# Patient Record
Sex: Male | Born: 1935 | Race: White | Hispanic: No | Marital: Single | Smoking: Never smoker
Health system: Southern US, Community
[De-identification: ages and names within clinical notes are randomized; demographics above are authoritative.]

## PROBLEM LIST (undated history)

## (undated) DIAGNOSIS — I739 Peripheral vascular disease, unspecified: Secondary | ICD-10-CM

## (undated) DIAGNOSIS — L089 Local infection of the skin and subcutaneous tissue, unspecified: Secondary | ICD-10-CM

## (undated) DIAGNOSIS — I10 Essential (primary) hypertension: Secondary | ICD-10-CM

## (undated) DIAGNOSIS — E782 Mixed hyperlipidemia: Secondary | ICD-10-CM

## (undated) DIAGNOSIS — N183 Chronic kidney disease, stage 3 (moderate): Secondary | ICD-10-CM

## (undated) HISTORY — DX: Local infection of the skin and subcutaneous tissue, unspecified: L08.9

## (undated) HISTORY — DX: Peripheral vascular disease, unspecified: I73.9

## (undated) HISTORY — DX: Essential (primary) hypertension: I10

## (undated) HISTORY — DX: Mixed hyperlipidemia: E78.2

## (undated) HISTORY — PX: KNEE ARTHROSCOPY: SUR90

## (undated) HISTORY — DX: Chronic kidney disease, stage 3 (moderate): N18.3

## (undated) HISTORY — PX: CATARACT EXTRACTION: SUR2

---

## 1992-04-29 HISTORY — PX: CORONARY ANGIOPLASTY: SHX604

## 2006-12-29 HISTORY — PX: KNEE ARTHROSCOPY: SUR90

## 2015-11-17 DIAGNOSIS — I10 Essential (primary) hypertension: Secondary | ICD-10-CM

## 2015-11-17 DIAGNOSIS — N183 Chronic kidney disease, stage 3 unspecified: Secondary | ICD-10-CM

## 2015-11-17 DIAGNOSIS — E782 Mixed hyperlipidemia: Secondary | ICD-10-CM

## 2015-11-17 HISTORY — DX: Mixed hyperlipidemia: E78.2

## 2015-11-17 HISTORY — DX: Chronic kidney disease, stage 3 unspecified: N18.30

## 2015-11-17 HISTORY — DX: Essential (primary) hypertension: I10

## 2017-02-27 HISTORY — PX: OTHER SURGICAL HISTORY: SHX169

## 2017-03-27 LAB — BASIC METABOLIC PANEL
BUN: 30 — AB (ref 4–21)
CREATININE: 1.4 — AB (ref 0.6–1.3)
POTASSIUM: 4.6 (ref 3.4–5.3)
SODIUM: 141 (ref 137–147)

## 2017-03-27 LAB — CBC AND DIFFERENTIAL
HCT: 35 — AB (ref 41–53)
Hemoglobin: 11 — AB (ref 13.5–17.5)
Platelets: 142 — AB (ref 150–399)
WBC: 16.8

## 2017-03-27 LAB — HEPATIC FUNCTION PANEL
ALK PHOS: 71 (ref 25–125)
ALT: 15 (ref 10–40)
AST: 18 (ref 14–40)

## 2017-03-27 LAB — POCT ERYTHROCYTE SEDIMENTATION RATE, NON-AUTOMATED: Sed Rate: 36

## 2017-03-28 DIAGNOSIS — L089 Local infection of the skin and subcutaneous tissue, unspecified: Secondary | ICD-10-CM

## 2017-03-28 HISTORY — DX: Local infection of the skin and subcutaneous tissue, unspecified: L08.9

## 2017-03-28 LAB — BASIC METABOLIC PANEL
BUN: 29 — AB (ref 4–21)
Creatinine: 1.1 (ref 0.6–1.3)
Glucose: 99
Potassium: 3.8 (ref 3.4–5.3)
SODIUM: 141 (ref 137–147)

## 2017-03-29 DIAGNOSIS — I739 Peripheral vascular disease, unspecified: Secondary | ICD-10-CM

## 2017-03-29 HISTORY — PX: OTHER SURGICAL HISTORY: SHX169

## 2017-03-29 HISTORY — DX: Peripheral vascular disease, unspecified: I73.9

## 2017-04-06 LAB — BASIC METABOLIC PANEL
BUN: 26 — AB (ref 4–21)
Creatinine: 1.3 (ref 0.6–1.3)
POTASSIUM: 4.2 (ref 3.4–5.3)
SODIUM: 139 (ref 137–147)

## 2017-04-07 LAB — CBC AND DIFFERENTIAL
HEMATOCRIT: 27 — AB (ref 41–53)
Hemoglobin: 8.3 — AB (ref 13.5–17.5)
PLATELETS: 194 (ref 150–399)
WBC: 12.9

## 2017-04-10 LAB — BASIC METABOLIC PANEL
BUN: 36 — AB (ref 4–21)
CREATININE: 1.3 (ref 0.6–1.3)
Potassium: 4.2 (ref 3.4–5.3)
Sodium: 139 (ref 137–147)

## 2017-04-11 LAB — BASIC METABOLIC PANEL
BUN: 33 — AB (ref 4–21)
Creatinine: 1.2 (ref 0.6–1.3)
Potassium: 5.2 (ref 3.4–5.3)
Sodium: 137 (ref 137–147)

## 2017-04-11 LAB — CBC AND DIFFERENTIAL
HEMATOCRIT: 27 — AB (ref 41–53)
HEMOGLOBIN: 8.2 — AB (ref 13.5–17.5)

## 2017-04-14 ENCOUNTER — Non-Acute Institutional Stay (SKILLED_NURSING_FACILITY): Payer: Medicare Other | Admitting: Adult Health

## 2017-04-14 ENCOUNTER — Encounter: Payer: Self-pay | Admitting: Adult Health

## 2017-04-14 DIAGNOSIS — I10 Essential (primary) hypertension: Secondary | ICD-10-CM | POA: Diagnosis not present

## 2017-04-14 DIAGNOSIS — F1090 Alcohol use, unspecified, uncomplicated: Secondary | ICD-10-CM

## 2017-04-14 DIAGNOSIS — M869 Osteomyelitis, unspecified: Secondary | ICD-10-CM

## 2017-04-14 DIAGNOSIS — D62 Acute posthemorrhagic anemia: Secondary | ICD-10-CM | POA: Diagnosis not present

## 2017-04-14 DIAGNOSIS — I739 Peripheral vascular disease, unspecified: Secondary | ICD-10-CM

## 2017-04-14 DIAGNOSIS — R339 Retention of urine, unspecified: Secondary | ICD-10-CM | POA: Diagnosis not present

## 2017-04-14 DIAGNOSIS — Z7289 Other problems related to lifestyle: Secondary | ICD-10-CM | POA: Diagnosis not present

## 2017-04-14 DIAGNOSIS — N183 Chronic kidney disease, stage 3 unspecified: Secondary | ICD-10-CM

## 2017-04-14 DIAGNOSIS — I2510 Atherosclerotic heart disease of native coronary artery without angina pectoris: Secondary | ICD-10-CM | POA: Diagnosis not present

## 2017-04-14 NOTE — Progress Notes (Signed)
Location:  [facility] Room Number: 762-G Place of Service:  SNF (31) Provider:  Durenda Age, NP  No care team member to display  No emergency contact information on file.  Code Status:  Full Code   Chief Complaint  Patient presents with  . Acute Visit    Followup on hospitalization at [facility] 11/29-12/15/18 for left foot ulcer with osteomyelitis     HPI:  Pt is a 83 y.o. male seen today for followup of hospitalization at [facility] 03/27/17-04/12/17 for left foot osteomyelitis, S/P left foot 4th ray amputation. He reported a callus on the bottom of his foot with increasing pain and swelling. He was started on empiric Vancomycin and Unasyn. MRI showed osteomyelitis of the 4th metatarsal head. Podiatry, Dr. Prudy Feeler, was consulted. He underwent debridement with 4th ray amputation on 03/30/17. There was extensive infection noted and patient bled poorly. Vascular surgery was consulted and had angiogram on 03/31/17. Patient had a chronically occluded arterial tibial artery for which he had angioplasty of left distal posterior tibial artery and proximal peroneal artery. He was brought back to the operating room on 04/04/17. Intraoperative tissue and bone culture grew Proteus mirabilis. Dr. Prudy Feeler, podiatry, recommended 3 weeks of oral antibiotics from date of closure. He was getting Rocephin and was transitioned to Augmentin (stop date 04/25/17). He will be nonweightbearing on the left. Protective cast was placed on 04/09/17. He had complication of urinary retention with gross hematuria. It was thought to be due to traumatic foley catheter placement. Urology was consulted for which he had continuous bladder irrigation. He accidentally pulled out his  3-way foley catheter and was re-inserted on 04/04/17. He continued to have hematuria so urology performed cystoscopy on 04/10/17 and evacuated a large clot in the bladder. Continuous  bladder irrigation was continued X 24 hours then urine became clear and CBI was discontinued.  The patient was admitted to [place] for short-term rehabilitation on 04/12/17.  He has a PMH of benign non-nodular prostatic hyperplasia, CKD stage 3, angina pectoris, essential HTN, frequent falls, mixed HLD, PUD, and non-traumatic rhabdomyolysis. He was seen in the room today. He verbalized that his pain is well-controlled, 4/10.  He has been admitted to [facility] on 04/12/17 for short-term rehabilitation. He was seen in his room today and verbalized that his left foot pain is "tolerable, 4/10."    Past Medical History:  Diagnosis Date  . CKD (chronic kidney disease) stage 3, GFR 30-59 ml/min (HCC) 11/17/2015  . Essential hypertension 11/17/2015  . Left foot infection 03/28/2017  . Mixed hyperlipidemia 11/17/2015  . PAD (peripheral artery disease) ([place]) 03/29/2017  . Urinary retention 03/28/2017   Past Surgical History:  Procedure Laterality Date  . CYSTOSCOPY WIHT CLOT EVACUATION/FULGURATION  02/2017  . FOOT DEBRIDEMENT WITH 4TH RAY AMPUTATION Left 03/2017    No Known Allergies  Outpatient Encounter Medications as of 04/14/2017  Medication Sig  . acetaminophen (TYLENOL) 325 MG tablet Take 650 mg by mouth every 6 (six) hours as needed for mild pain or moderate pain.  Marland Kitchen amoxicillin-clavulanate (AUGMENTIN) 875-125 MG tablet Take 1 tablet by mouth 2 (two) times daily.  Marland Kitchen aspirin 81 MG chewable tablet Chew 81 mg by mouth daily.  . clotrimazole-betamethasone (LOTRISONE) cream Apply 1 application topically 2 (two) times daily. Apply to perineum and groin  . ferrous sulfate 325 (65 FE) MG tablet Take 325 mg by mouth 2 (two) times daily with a meal.  .  folic acid (FOLVITE) 1 MG tablet Take 1 mg by mouth daily.  Marland Kitchen HYDROcodone-acetaminophen (NORCO/VICODIN) 5-325 MG tablet Take 1 tablet by mouth every 6 (six) hours as needed for severe pain.  Marland Kitchen  lisinopril (PRINIVIL,ZESTRIL) 5 MG tablet Take 5 mg by mouth daily.  . metoprolol succinate (TOPROL-XL) 50 MG 24 hr tablet Take 50 mg by mouth daily. Take with or immediately following a meal.  . nitroGLYCERIN (NITROSTAT) 0.4 MG SL tablet Place 0.4 mg under the tongue every 5 (five) minutes as needed for chest pain.  . pravastatin (PRAVACHOL) 20 MG tablet Take 20 mg by mouth daily.  . tamsulosin (FLOMAX) 0.4 MG CAPS capsule Take 0.4 mg by mouth daily.  Marland Kitchen thiamine 100 MG tablet Take 100 mg by mouth daily.   No facility-administered encounter medications on file as of 04/14/2017.     Review of Systems  GENERAL: No change in appetite, no fatigue, no weight changes, no fever, chills or weakness MOUTH and THROAT: Denies oral discomfort, gingival pain or bleeding RESPIRATORY: no cough, SOB, DOE, wheezing, hemoptysis CARDIAC: No chest pain, edema or palpitations GI: No abdominal pain, diarrhea, constipation, heart burn, nausea or vomiting GU: Denies dysuria,  PSYCHIATRIC: Denies feelings of depression or anxiety. No report of hallucinations, insomnia, paranoia, or agitation     Vitals:   04/14/17 1348  BP: 132/68  Pulse: 68  Resp: 18  Temp: 98 F (36.7 C)  TempSrc: Oral  SpO2: 99%  Weight: 170 lb (77.1 kg)  Height: 5\' 10"  (1.778 m)   Body mass index is 24.39 kg/m.  Physical Exam  GENERAL APPEARANCE: Well nourished. In no acute distress. Normal body habitus SKIN:  Skin is warm and dry.  MOUTH and THROAT: Lips are without lesions. Oral mucosa is moist and without lesions. RESPIRATORY: Breathing is even & unlabored, BS CTAB CARDIAC: RRR, no murmur,no extra heart sounds, no edema GI: Abdomen soft, normal BS, no masses, no tenderness GU:  Has foley catheter draining to urine bag with clear yellowish urine EXTREMITIES:  Able to move X 4 extremities, Left foot and lower leg on cast PSYCHIATRIC: Alert to self, time, does not know where he is.  Affect and behavior are appropriate      Labs reviewed: Recent Labs    03/28/17 04/10/17 04/11/17  NA 141 139 137  K 3.8 4.2 5.2  BUN 29* 36* 33*  CREATININE 1.1 1.3 1.2   Recent Labs    03/27/17  AST 18  ALT 15  ALKPHOS 71   Recent Labs    03/27/17 04/07/17 04/11/17  WBC 16.8 12.9  --   HGB 11.0* 8.3* 8.2*  HCT 35* 27* 27*  PLT 142* 194  --     Assessment/Plan  1. Osteomyelitis of toe of left foot ([place]) - S/P amputation of left foot 4th ray on 03/30/17, was empirically started on vancomycin and Unasyn. MRI showed ostial melitis of the fourth metatarsal head. Podiatry, Dr. Prudy Feeler, was consulted. He underwent debridement with 4th ray amputation on 03/30/17. There was extensive infection noted and patient bled poorly. Vascular surgery was consulted and had angiogram on 03/31/17. Patient had a chronically occluded arterial tibial artery for which he had angioplasty of left distal posterior tibial artery and proximal peroneal artery. He was brought back to the operating room on 04/04/17. Intraoperative tissue and bone culture grew Proteus mirabilis. Dr. Prudy Feeler, podiatry, recommended 3 weeks of oral antibiotics from date of closure. He was getting Rocephin and was transitioned to Augmentin (stop date  04/25/17). He will be nonweightbearing on the left. Protective cast was placed on 04/09/17. Follow-up with Dr. Erline Hau, podiatry, continue Tylenol 325 mg give 2 tabs = 650 mg every 6 hours when necessary and Norco 5-325 milligrams 1 tab every 6 hours when necessary for pain   2. PAD (peripheral artery disease) ([place]) -  he bled poorly. Vascular surgery was consulted and had angiogram on 03/31/17. Patient had a chronically occluded arterial tibial artery for which he had angioplasty of left distal posterior tibial artery and proximal peroneal artery, will continue pravastatin 20 mg 1 tab daily and aspirin 81 mg 1 tab daily. Follow-up with Vascular surgery, Dr. Rebecca Eaton   3. Urinary retention - He had complication of  urinary retention with gross hematuria. It was thought to be due to traumatic foley catheter placement. Urology was consulted for which he had continuous bladder irrigation. He accidentally pulled out his  3-way foley catheter and was re-inserted on 04/04/17. He continued to have hematuria so urology performed cystoscopy on 04/10/17 and evacuated a large clot in the bladder. Continuous bladder irrigation was continued X 24 hours then urine became clear and CBI was discontinued. Follow-up with urology, Dr. Ottie Glazier, in 2 weeks for trial voiding, continue Flomax 0.4 mg 1 capsule daily   4. Anemia due to acute blood loss - S/P surgery, continue iron 325 mg 1 tab twice a day, check CBC in 1 week Lab Results  Component Value Date   HGB 8.2 (A) 04/11/2017     5. Chronic alcohol use - monitor for withdrawal, continue folic acid 1 mg 1 tab daily and thiamine 100 mg daily   6. Coronary artery disease involving native coronary artery of native heart without angina pectoris - no complaints of chest pain, continue NTG when necessary, aspirin 81 mg 1 tab daily   7. Chronic kidney disease, stage 3 (HCC) - will recheck BMP in 1 week Lab Results  Component Value Date   CREATININE 1.2 04/11/2017   8. Essential hypertension -  continue lisinopril 5 mg 1 tab daily and Toprol-XL 50 mg 1 tab daily    Family/ staff Communication:  Discussed plan of care with patient.  Labs/tests ordered:  CBC and BMP in 1 week  Goals of care:   Short-term care   Durenda Age, NP [facility] [phone] (Monday-Friday 8:00 a.m. - 5:00 p.m.) [phone] (after hours)

## 2017-04-15 ENCOUNTER — Non-Acute Institutional Stay (SKILLED_NURSING_FACILITY): Payer: Medicare Other | Admitting: Internal Medicine

## 2017-04-15 ENCOUNTER — Encounter: Payer: Self-pay | Admitting: Internal Medicine

## 2017-04-15 DIAGNOSIS — D489 Neoplasm of uncertain behavior, unspecified: Secondary | ICD-10-CM | POA: Diagnosis not present

## 2017-04-15 DIAGNOSIS — R319 Hematuria, unspecified: Secondary | ICD-10-CM | POA: Insufficient documentation

## 2017-04-15 DIAGNOSIS — I2510 Atherosclerotic heart disease of native coronary artery without angina pectoris: Secondary | ICD-10-CM | POA: Insufficient documentation

## 2017-04-15 DIAGNOSIS — N183 Chronic kidney disease, stage 3 unspecified: Secondary | ICD-10-CM

## 2017-04-15 DIAGNOSIS — D62 Acute posthemorrhagic anemia: Secondary | ICD-10-CM

## 2017-04-15 DIAGNOSIS — M869 Osteomyelitis, unspecified: Secondary | ICD-10-CM

## 2017-04-15 DIAGNOSIS — R310 Gross hematuria: Secondary | ICD-10-CM | POA: Diagnosis not present

## 2017-04-15 DIAGNOSIS — G9341 Metabolic encephalopathy: Secondary | ICD-10-CM | POA: Diagnosis not present

## 2017-04-15 DIAGNOSIS — I739 Peripheral vascular disease, unspecified: Secondary | ICD-10-CM | POA: Diagnosis not present

## 2017-04-15 DIAGNOSIS — E782 Mixed hyperlipidemia: Secondary | ICD-10-CM | POA: Insufficient documentation

## 2017-04-15 DIAGNOSIS — I809 Phlebitis and thrombophlebitis of unspecified site: Secondary | ICD-10-CM | POA: Insufficient documentation

## 2017-04-15 NOTE — Assessment & Plan Note (Signed)
Monitor at [facility]

## 2017-04-15 NOTE — Assessment & Plan Note (Addendum)
Continue Augmentin until 04/25/17 Add probiotic

## 2017-04-15 NOTE — Progress Notes (Signed)
NURSING HOME LOCATION:  [facility] ROOM NUMBER:  [id number]  CODE STATUS:  Full Code  PCP: Dr. Ivan Anchors, [place], [place]. He also is seen at the [place] [facility] in [place]  This is a comprehensive admission note to [facility] performed on this date less than 30 days from date of admission. Included are preadmission medical/surgical history;reconciled medication list; family history; social history and comprehensive review of systems.  Corrections and additions to the records were documented. Comprehensive physical exam was also performed. Additionally a clinical summary was entered for each active diagnosis pertinent to this admission in the Problem List to enhance continuity of care.  HPI: The patient was hospitalized 11/29-12/15/18 for osteomyelitis of the toe left foot. He was transferred from the [facility] facility to [facility] in [place]. MRI revealed osteomyelitis of the first metatarsal head. On 12/20 he underwent debridement with fourth ray amputation. Culture @ follow up debridement 12/7 revealed Proteus mirabilis. Rocephin was initiated and subsequently transitioned to Augmentin with a stop date of 12/28. Postoperatively there was poor bleeding prompting a vascular evaluation which revealed opacity of the left distal tibial artery and proximal peroneal artery.Angioplasty performed 12/3. Serial ABIs 11/30 &12/4 revealed improved left great toe pressure from 28 to 45. Aspirin and pravastatin were continued. Urinary retention of 500 mL prompted Foley placement 11/30. Gross hematuria resulted attributed to traumatic placement. Continuous bladder irrigation did not control the bleeding. The patient removed the Foley in the context of delirium 12/2. Because of persistent retention the catheter was reinserted 12/7. Cystoscopy 12/13 revealed a large clot in the bladder without active bleeding or tumor. Dr. Vernard Gambles plans follow-up in 2-3 weeks for voiding  trial versus Foley exchange. Hospital course was also complicated by delirium attributed to the acute illness & hospitalization superimposed on mild cognitive impairment. The patient did have a history of social alcohol use. Clinically alcohol withdrawal was not felt to be present. He did receive thiamine and folic acid. The patient did have significant anemia with hemoglobins as low as 7.9. On 12/14 hemoglobin was 8.2 and hematocrit 26.9. He did have "mahogany stools" on one occasion while hospitalized. The patient refused endoscopy by Dr. Reather Laurence, GI. Labs 12/14 revealed hemoglobin 8.2, hematocrit 26.9 BUN 33, creatinine 1.20 Here at the SNF staff reports that he is draining some blood from the irrigation port of the Foley. The irrigation port is not occluded  Past medical and surgical history: Includes mixed dyslipidemia, essential hypertension, and chronic kidney disease, stage III with GFR 30-59. Surgeries include bilateral knee arthroscopy. He had coronary angioplasty in 1994.  Social history: Social drinker, never smoked. He states he drinks red wine, one glass with evening meals. 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 Initially was in the [employer] but then transferred to the [employer]. He reads extensively, both fiction and history.  Family history: Reviewed  Review of systems: He states he's not "paid any attention" to the blood in the Foley. He has no genitourinary or GI symptoms.  Constitutional: No fever, significant weight change, fatigue  Eyes: No redness, discharge, pain, vision change ENT/mouth: No nasal congestion,  purulent discharge, earache, change in hearing, sore throat  Cardiovascular: No chest pain, palpitations, paroxysmal nocturnal dyspnea, claudication, edema  Respiratory: No cough, sputum production, hemoptysis, DOE, significant snoring, apnea   Gastrointestinal: No heartburn, dysphagia, abdominal pain, nausea/vomiting, rectal bleeding, melena, change in  bowels Genitourinary: No dysuria,  pyuria, incontinence, nocturia Musculoskeletal: No joint stiffness, joint swelling, weakness, pain Dermatologic:  No rash, pruritus, change in appearance of skin Neurologic: No dizziness, headache, syncope, seizures, numbness, ttingling Psychiatric: No significant anxiety , depression, insomnia, anorexia Endocrine: No change in hair/skin/ nails, excessive thirst, excessive hunger, excessive urination  Hematologic/lymphatic: No significant bruising, lymphadenopathy, abnormal bleeding Allergy/immunology: No itchy/ watery eyes, significant sneezing, urticaria, angioedema  Physical exam:  Pertinent or positive findings: Pattern alopecia is present Arcus senilis is present, pupils are small. He has low-grade rhonchi in all lung fields in a diffuse, homogenous pattern. Blood is noted in the Foley catheter and on the bed sheet. The pedal pulses the right lower extremity are decreased. The left lower extremity is casted. He has mixed mild arthritic changes of the hands. There is a large wart-like, hyperpigmented growth over the left pinna. The hyperpigmentation suggest ecchymosis. He states that he does scratch this lesion repeatedly.  General appearance: Adequately nourished; no acute distress, increased work of breathing is present.   Lymphatic: No lymphadenopathy about the head, neck, axilla . Eyes: No conjunctival inflammation or lid edema is present. There is no scleral icterus. Ears:  External ear exam shows no significant lesions or deformities.   Nose:  External nasal examination shows no deformity or inflammation. Nasal mucosa are pink and moist without lesions, exudates Oral exam: Lips and gums are healthy appearing.There is no oropharyngeal erythema or exudate . Neck:  No thyromegaly, masses, tenderness noted.    Heart:  Normal rate and regular rhythm. S1 and S2 normal without gallop, murmur, click, rub .  Lungs:  without wheezes, rales, rubs. Abdomen: Bowel  sounds are normal. Abdomen is soft and nontender with no organomegaly, hernias, masses. GU: Deferred  Extremities:  No cyanosis, clubbing  Neurologic exam:   Strength equal  in upper & lower extremities Balance, Rhomberg, finger to nose testing could not be completed due to clinical state Skin: Warm & dry w/o tenting. No significant  rash.  See clinical summary under each active problem in the Problem List with associated updated therapeutic plan

## 2017-04-15 NOTE — Assessment & Plan Note (Signed)
Minimize iatrogenic factors

## 2017-04-15 NOTE — Patient Instructions (Addendum)
See assessment and plan under each diagnosis in the problem list and acutely for this visit Total time 56  minutes; greater than 50% of the visit spent reviewing extensive outside medical records to create Epic chart,counseling patient and coordinating care for problems addressed at this encounter

## 2017-04-15 NOTE — Assessment & Plan Note (Signed)
Prophylactic aspirin, continue statin

## 2017-04-15 NOTE — Assessment & Plan Note (Addendum)
04/15/17 some blood draining from the open irrigation port. Nursing inquired about changing the Foley. He has a history of traumatic hematuria. Because of his complicated urologic history, this should be deferred to his Urologist Urology clinic follow-up at [facility] in [place]

## 2017-04-16 ENCOUNTER — Inpatient Hospital Stay (HOSPITAL_COMMUNITY)
Admission: EM | Admit: 2017-04-16 | Discharge: 2017-04-19 | DRG: 699 | Disposition: A | Discharge status: Skilled Nursing Facility | Payer: Medicare Other | Attending: Internal Medicine | Admitting: Internal Medicine

## 2017-04-16 ENCOUNTER — Emergency Department (HOSPITAL_COMMUNITY): Payer: Medicare Other

## 2017-04-16 ENCOUNTER — Encounter (HOSPITAL_COMMUNITY): Payer: Self-pay | Admitting: Emergency Medicine

## 2017-04-16 DIAGNOSIS — F0390 Unspecified dementia without behavioral disturbance: Secondary | ICD-10-CM | POA: Diagnosis present

## 2017-04-16 DIAGNOSIS — D489 Neoplasm of uncertain behavior, unspecified: Secondary | ICD-10-CM | POA: Insufficient documentation

## 2017-04-16 DIAGNOSIS — N401 Enlarged prostate with lower urinary tract symptoms: Secondary | ICD-10-CM | POA: Diagnosis present

## 2017-04-16 DIAGNOSIS — Z7982 Long term (current) use of aspirin: Secondary | ICD-10-CM

## 2017-04-16 DIAGNOSIS — T83028A Displacement of other indwelling urethral catheter, initial encounter: Secondary | ICD-10-CM | POA: Diagnosis not present

## 2017-04-16 DIAGNOSIS — N400 Enlarged prostate without lower urinary tract symptoms: Secondary | ICD-10-CM | POA: Diagnosis not present

## 2017-04-16 DIAGNOSIS — D62 Acute posthemorrhagic anemia: Secondary | ICD-10-CM | POA: Diagnosis not present

## 2017-04-16 DIAGNOSIS — N183 Chronic kidney disease, stage 3 unspecified: Secondary | ICD-10-CM | POA: Diagnosis present

## 2017-04-16 DIAGNOSIS — M869 Osteomyelitis, unspecified: Secondary | ICD-10-CM | POA: Diagnosis not present

## 2017-04-16 DIAGNOSIS — R310 Gross hematuria: Secondary | ICD-10-CM | POA: Diagnosis not present

## 2017-04-16 DIAGNOSIS — D72829 Elevated white blood cell count, unspecified: Secondary | ICD-10-CM | POA: Diagnosis present

## 2017-04-16 DIAGNOSIS — Z9861 Coronary angioplasty status: Secondary | ICD-10-CM

## 2017-04-16 DIAGNOSIS — D649 Anemia, unspecified: Secondary | ICD-10-CM | POA: Diagnosis not present

## 2017-04-16 DIAGNOSIS — N3289 Other specified disorders of bladder: Secondary | ICD-10-CM | POA: Diagnosis present

## 2017-04-16 DIAGNOSIS — R319 Hematuria, unspecified: Secondary | ICD-10-CM | POA: Diagnosis present

## 2017-04-16 DIAGNOSIS — I129 Hypertensive chronic kidney disease with stage 1 through stage 4 chronic kidney disease, or unspecified chronic kidney disease: Secondary | ICD-10-CM | POA: Diagnosis present

## 2017-04-16 DIAGNOSIS — R338 Other retention of urine: Secondary | ICD-10-CM | POA: Diagnosis present

## 2017-04-16 DIAGNOSIS — I739 Peripheral vascular disease, unspecified: Secondary | ICD-10-CM | POA: Diagnosis present

## 2017-04-16 DIAGNOSIS — Y846 Urinary catheterization as the cause of abnormal reaction of the patient, or of later complication, without mention of misadventure at the time of the procedure: Secondary | ICD-10-CM | POA: Diagnosis present

## 2017-04-16 DIAGNOSIS — M86672 Other chronic osteomyelitis, left ankle and foot: Secondary | ICD-10-CM | POA: Diagnosis present

## 2017-04-16 DIAGNOSIS — Z807 Family history of other malignant neoplasms of lymphoid, hematopoietic and related tissues: Secondary | ICD-10-CM

## 2017-04-16 DIAGNOSIS — N3091 Cystitis, unspecified with hematuria: Secondary | ICD-10-CM | POA: Diagnosis present

## 2017-04-16 DIAGNOSIS — E782 Mixed hyperlipidemia: Secondary | ICD-10-CM | POA: Diagnosis present

## 2017-04-16 LAB — COMPREHENSIVE METABOLIC PANEL
ALK PHOS: 71 U/L (ref 38–126)
ALT: 21 U/L (ref 17–63)
ANION GAP: 7 (ref 5–15)
AST: 23 U/L (ref 15–41)
Albumin: 3.4 g/dL — ABNORMAL LOW (ref 3.5–5.0)
BUN: 41 mg/dL — ABNORMAL HIGH (ref 6–20)
CALCIUM: 9.1 mg/dL (ref 8.9–10.3)
CHLORIDE: 107 mmol/L (ref 101–111)
CO2: 22 mmol/L (ref 22–32)
CREATININE: 1.45 mg/dL — AB (ref 0.61–1.24)
GFR, EST AFRICAN AMERICAN: 46 mL/min — AB (ref >60)
GFR, EST NON AFRICAN AMERICAN: 40 mL/min — AB (ref >60)
Glucose, Bld: 112 mg/dL — ABNORMAL HIGH (ref 65–99)
Potassium: 4.1 mmol/L (ref 3.5–5.1)
SODIUM: 136 mmol/L (ref 135–145)
Total Bilirubin: 0.5 mg/dL (ref 0.3–1.2)
Total Protein: 6 g/dL — ABNORMAL LOW (ref 6.5–8.1)

## 2017-04-16 LAB — URINALYSIS, ROUTINE W REFLEX MICROSCOPIC

## 2017-04-16 LAB — CBC WITH DIFFERENTIAL/PLATELET
BASOS PCT: 0 %
Basophils Absolute: 0.1 10*3/uL (ref 0.0–0.1)
EOS ABS: 0.1 10*3/uL (ref 0.0–0.7)
Eosinophils Relative: 1 %
HEMATOCRIT: 24.5 % — AB (ref 39.0–52.0)
HEMOGLOBIN: 7.9 g/dL — AB (ref 13.0–17.0)
Lymphocytes Relative: 12 %
Lymphs Abs: 1.6 10*3/uL (ref 0.7–4.0)
MCH: 33.2 pg (ref 26.0–34.0)
MCHC: 32.2 g/dL (ref 30.0–36.0)
MCV: 102.9 fL — ABNORMAL HIGH (ref 78.0–100.0)
Monocytes Absolute: 0.5 10*3/uL (ref 0.1–1.0)
Monocytes Relative: 3 %
NEUTROS ABS: 12.1 10*3/uL — AB (ref 1.7–7.7)
Neutrophils Relative %: 84 %
Platelets: 152 10*3/uL (ref 150–400)
RBC: 2.38 MIL/uL — AB (ref 4.22–5.81)
RDW: 15.1 % (ref 11.5–15.5)
WBC: 14.3 10*3/uL — AB (ref 4.0–10.5)

## 2017-04-16 LAB — URINALYSIS, MICROSCOPIC (REFLEX)

## 2017-04-16 LAB — PREPARE RBC (CROSSMATCH)

## 2017-04-16 LAB — ABO/RH: ABO/RH(D): O NEG

## 2017-04-16 MED ORDER — SODIUM CHLORIDE 0.9 % IV SOLN
Freq: Once | INTRAVENOUS | Status: AC
  Administered 2017-04-17: 03:00:00 via INTRAVENOUS

## 2017-04-16 MED ORDER — DEXTROSE 5 % IV SOLN
1.0000 g | Freq: Once | INTRAVENOUS | Status: AC
  Administered 2017-04-16: 1 g via INTRAVENOUS
  Filled 2017-04-16: qty 10

## 2017-04-16 NOTE — H&P (Signed)
History and Physical    Jason Calderon YQI:[id number] DOB: 1921/07/26 DOA: 04/16/2017  Referring MD/NP/PA: Waynetta Pean, PA-C PCP: Ivan Anchors, MD  Patient coming from: [facility] via EMS  Chief Complaint: Bloody urine  I have personally briefly reviewed patient's old medical records in [place]   HPI: Jason Calderon is a 83 y.o. male with medical history significant of osteomyelitis of the left foot s/p left 4th digit amputation, dementia, PAD, BPH with urinary retention s/p Foley catheter; who presents with complaints of bloody urine.  Patient was just seen at [facility] for the same thought to be secondary to a traumatic Foley catheter insertion.  At that time he underwent cystoscopy with clot evacuation and fulguration.  There is no note of any significant masses or abnormalities noted at that time.  Bleeding had apparently cleared following discharge, but the patient was noted to again have bloody urine in his Foley bag when seen by his son today.  Patient was initially noted to be in no acute discomfort and had no complaints.  ED Course: Upon admission into the emergency department patient was seen to be afebrile, pulse 56-82, respirations 17-19, blood pressure 105/55 at 153/56, and O2 saturation maintained.  Labs revealed WBC 14.3, hemoglobin 7.9, BUN 41, creatinine 1.45.  Patient was ordered to be transfused 1 unit of blood.  A CT scan was requested and found to reveal that the Foley balloon was in the prostatic urethra.  Dr. Diona Fanti of urology was consulted and recommended replacing Foley catheter.  However, after placing Foley catheter patient was noted to be in significant discomfort.    Review of Systems  Unable to perform ROS: Dementia  Genitourinary: Positive for hematuria.       Penile discomfort  Psychiatric/Behavioral: Positive for memory loss.    Past Medical History:  Diagnosis Date  . CKD (chronic kidney disease) stage 3, GFR 30-59 ml/min (HCC) 11/17/2015    . Essential hypertension 11/17/2015  . Left foot infection 03/28/2017  . Mixed hyperlipidemia 11/17/2015  . PAD (peripheral artery disease) ([place]) 03/29/2017    Past Surgical History:  Procedure Laterality Date  . CATARACT EXTRACTION    . CORONARY ANGIOPLASTY  1994  . CYSTOSCOPY WITH CLOT EVACUATION/FULGURATION  02/2017  . FOOT DEBRIDEMENT WITH 4TH RAY AMPUTATION Left 03/2017  . KNEE ARTHROSCOPY Right 12/2006  . KNEE ARTHROSCOPY Left 11/2007 and 12/2008     reports that  has never smoked. he has never used smokeless tobacco. He reports that he drinks about 5.4 oz of alcohol per week. His drug history is not on file.  No Known Allergies  Family History  Problem Relation Age of Onset  . GI problems Mother   . Breast cancer Sister   . Irritable bowel syndrome Sister   . Lymphoma Sister   . Sudden death Brother     Prior to Admission medications   Medication Sig Start Date End Date Taking? Authorizing Provider  acetaminophen (TYLENOL) 325 MG tablet Take 650 mg by mouth every 6 (six) hours as needed for mild pain or moderate pain.   Yes [provider]  Amino Acids-Protein Hydrolys (FEEDING SUPPLEMENT, PRO-STAT SUGAR FREE 64,) LIQD Take 30 mLs by mouth daily.   Yes [provider]  amoxicillin-clavulanate (AUGMENTIN) 875-125 MG tablet Take 1 tablet by mouth 2 (two) times daily.  04/12/17 04/25/17 Yes [provider]  aspirin 81 MG chewable tablet Chew 81 mg by mouth daily.   Yes [provider]  clotrimazole-betamethasone Donalynn Furlong)  cream Apply 1 application topically 2 (two) times daily. The cream also has Dipropionate Apply to perineum and groin   Yes [provider]  ferrous sulfate 325 (65 FE) MG tablet Take 325 mg by mouth 2 (two) times daily with a meal.   Yes [provider]  folic acid (FOLVITE) 1 MG tablet Take 1 mg by mouth daily.   Yes [provider]  HYDROcodone-acetaminophen (NORCO/VICODIN) 5-325 MG tablet  Take 1 tablet by mouth every 6 (six) hours as needed for severe pain.   Yes [provider]  lisinopril (PRINIVIL,ZESTRIL) 5 MG tablet Take 5 mg by mouth daily.   Yes [provider]  metoprolol succinate (TOPROL-XL) 50 MG 24 hr tablet Take 50 mg by mouth daily. Take with or immediately following a meal.   Yes [provider]  nitroGLYCERIN (NITROSTAT) 0.4 MG SL tablet Place 0.4 mg under the tongue every 5 (five) minutes as needed for chest pain.   Yes [provider]  NUTRITIONAL SUPPLEMENT LIQD Take 120 mLs by mouth. [employer]   Yes [provider]  pravastatin (PRAVACHOL) 20 MG tablet Take 20 mg by mouth daily.   Yes [provider]  senna-docusate (SENOKOT-S) 8.6-50 MG tablet Take 2 tablets by mouth at bedtime.   Yes [provider]  tamsulosin (FLOMAX) 0.4 MG CAPS capsule Take 0.4 mg by mouth daily.   Yes [provider]  thiamine 100 MG tablet Take 100 mg by mouth daily.   Yes [provider]    Physical Exam:  Constitutional: Elderly male who appears to be in some acute discomfort. Vitals:   04/16/17 2030 04/16/17 2045 04/16/17 2100 04/16/17 2115  BP: (!) 145/78 130/61 (!) 153/56 (!) 124/59  Pulse: (!) 57 68 80 70  Resp: 18   18  Temp:      TempSrc:      SpO2: 99% 99% 100% 100%  Weight:      Height:       Eyes: PERRL, lids and conjunctivae normal ENMT: Mucous membranes are moist. Posterior pharynx clear of any exudate or lesions. Neck: normal, supple, no masses, no thyromegaly Respiratory: clear to auscultation bilaterally, no wheezing, no crackles. Normal respiratory effort. No accessory muscle use.  Cardiovascular: Regular rate and rhythm, no murmurs / rubs / gallops. No extremity edema. 2+ pedal pulses. No carotid bruits.  Abdomen: no tenderness, no masses palpated. No hepatosplenomegaly. Bowel sounds positive.  Musculoskeletal: no clubbing / cyanosis. No joint deformity upper and lower  extremities. Good ROM, no contractures. Normal muscle tone.  Skin: no rashes, lesions, ulcers. No induration Neurologic: CN 2-12 grossly intact. Sensation intact, DTR normal. Strength 5/5 in all 4.  Psychiatric: Alert, but oriented only to person.    Labs on Admission: I have personally reviewed following labs and imaging studies  CBC: Recent Labs  Lab 04/11/17 04/16/17 1747  WBC  --  14.3*  NEUTROABS  --  12.1*  HGB 8.2* 7.9*  HCT 27* 24.5*  MCV  --  102.9*  PLT  --  063   Basic Metabolic Panel: Recent Labs  Lab 04/10/17 04/11/17 04/16/17 1747  NA 139 137 136  K 4.2 5.2 4.1  CL  --   --  107  CO2  --   --  22  GLUCOSE  --   --  112*  BUN 36* 33* 41*  CREATININE 1.3 1.2 1.45*  CALCIUM  --   --  9.1   GFR: Estimated Creatinine Clearance: 31.8  mL/min (A) (by C-G formula based on SCr of 1.45 mg/dL (H)). Liver Function Tests: Recent Labs  Lab 04/16/17 1747  AST 23  ALT 21  ALKPHOS 71  BILITOT 0.5  PROT 6.0*  ALBUMIN 3.4*   No results for input(s): LIPASE, AMYLASE in the last 168 hours. No results for input(s): AMMONIA in the last 168 hours. Coagulation Profile: No results for input(s): INR, PROTIME in the last 168 hours. Cardiac Enzymes: No results for input(s): CKTOTAL, CKMB, CKMBINDEX, TROPONINI in the last 168 hours. BNP (last 3 results) No results for input(s): PROBNP in the last 8760 hours. HbA1C: No results for input(s): HGBA1C in the last 72 hours. CBG: No results for input(s): GLUCAP in the last 168 hours. Lipid Profile: No results for input(s): CHOL, HDL, LDLCALC, TRIG, CHOLHDL, LDLDIRECT in the last 72 hours. Thyroid Function Tests: No results for input(s): TSH, T4TOTAL, FREET4, T3FREE, THYROIDAB in the last 72 hours. Anemia Panel: No results for input(s): VITAMINB12, FOLATE, FERRITIN, TIBC, IRON, RETICCTPCT in the last 72 hours. Urine analysis:    Component Value Date/Time   COLORURINE RED (A) 04/16/2017 1747   APPEARANCEUR TURBID (A)  04/16/2017 1747   LABSPEC  04/16/2017 1747    TEST NOT REPORTED DUE TO COLOR INTERFERENCE OF URINE PIGMENT   PHURINE  04/16/2017 1747    TEST NOT REPORTED DUE TO COLOR INTERFERENCE OF URINE PIGMENT   GLUCOSEU (A) 04/16/2017 1747    TEST NOT REPORTED DUE TO COLOR INTERFERENCE OF URINE PIGMENT   HGBUR (A) 04/16/2017 1747    TEST NOT REPORTED DUE TO COLOR INTERFERENCE OF URINE PIGMENT   BILIRUBINUR (A) 04/16/2017 1747    TEST NOT REPORTED DUE TO COLOR INTERFERENCE OF URINE PIGMENT   KETONESUR (A) 04/16/2017 1747    TEST NOT REPORTED DUE TO COLOR INTERFERENCE OF URINE PIGMENT   PROTEINUR (A) 04/16/2017 1747    TEST NOT REPORTED DUE TO COLOR INTERFERENCE OF URINE PIGMENT   NITRITE (A) 04/16/2017 1747    TEST NOT REPORTED DUE TO COLOR INTERFERENCE OF URINE PIGMENT   LEUKOCYTESUR (A) 04/16/2017 1747    TEST NOT REPORTED DUE TO COLOR INTERFERENCE OF URINE PIGMENT   Sepsis Labs: No results found for this or any previous visit (from the past 240 hour(s)).   Radiological Exams on Admission: No results found.  CT scan abdomen: Independently reviewed.  Foley bulb in prosthetic urethra  Assessment/Plan Hematuria with acute blood loss anemia: Acute.  Patient presents with persistent hematuria.  Hemoglobin previously 11.1 on 11/29, and now presents with a hemoglobin of 7.9.  CT scan of the abdomen requested and revealed signs of a blood clot within the bladder with Foley catheter balloon in the prostatic urethra.  Urology consulted and recommended replacing Foley catheter.  However, after replacing Foley catheter patient began to be in acute pain.   - Admit to a telemetry bed - Replacing Foley catheter as recommended - Continue bladder irrigation - Serial monitoring of H&H - Will transfuse as needed for hemoglobin is less than 8 - Continue ferrous sulfate - Dr. Diona Fanti of urology consulted and will see this a.m.  Possible complicated urinary tract infection: Patient with indwelling Foley  catheter found to have normal urinalysis was abnormal showing signs of bacteria and too numerous to count RBCs and WBCs  - Follow-up urine culture - Continue empiric antibiotics of Rocephin  Leukocytosis : WBC elevated at 14.3 which appears slightly higher than previous.  Question possibility of underlying urinary infection. - Recheck CBC in  a.m.  Chronic kidney disease stage III: Presents with a creatinine of 1.45 with a BUN of 41.  Elevated BUN likely secondary to hematuria.  Baseline creatinine previously noted to be around 1.2-1.4 per available records. - Continue to monitor  Chronic osteomyelitis of the left toe - Continue Augmentin with stop date 12/28  Essential hypertension - Continue metoprolol lisinopril  Hyperlipidemia - Statin  BPH - Continue Flomax   DVT prophylaxis:   SCDs Code Status: Full  Family Communication: No family present Disposition Plan: TBD Consults called: urology Admission status: inpatient  Norval Morton MD [facility] Pager [phone]   If 7PM-7AM, please contact night-coverage [email or web address] Password [facility]  04/16/2017, 10:33 PM

## 2017-04-16 NOTE — Assessment & Plan Note (Signed)
Monitor BMET. 

## 2017-04-16 NOTE — ED Notes (Signed)
Gave food to pt per EDP.

## 2017-04-16 NOTE — ED Notes (Signed)
PT left for CT

## 2017-04-16 NOTE — ED Provider Notes (Signed)
[facility] EMERGENCY DEPARTMENT Provider Note   CSN: [id number] Arrival date & time: 04/16/17  1714     History   Chief Complaint Chief Complaint  Patient presents with  . Hematuria    HPI Jason Calderon is a 83 y.o. male.  Jason Calderon is a 83 y.o. Male who presents to the ED from his SNF with hematuria starting yesterday.  Patient has a chronic indwelling Foley catheter after some problems with urinary retention recently.  He had an episode of hematuria after traumatic insertion earlier this month.  He had a cystoscopy by urology on 04/10/2017 which showed no evidence of tumor or bleeding.  He has been staying at the skilled nursing facility for treatment for osteomyelitis of his toe and is currently on Augmentin until 04/25/2017.  Family reports is been compliant with his medication.  They report yesterday and today they noticed frank hematuria from his Foley catheter.  He has had no abdominal pain, nausea or vomiting.  He denies any lightheadedness or dizziness.  No chest pain or shortness of breath.  He denies any pain to his penis or his testicles.  Urine amount has been normal.  He is not on any blood thinners.  He was seen by his primary care doctor yesterday for this and he recommended follow-up as an outpatient with urology clinic.  Son reports that he was worried about the amount of hematuria today and brought him to the emergency department.  No fevers, abdominal pain, lightheadedness, dizziness, chest pain, shortness of breath, nausea, vomiting or rashes.   The history is provided by the patient, medical records and a relative. No language interpreter was used.  Hematuria  Pertinent negatives include no chest pain, no abdominal pain, no headaches and no shortness of breath.    Past Medical History:  Diagnosis Date  . CKD (chronic kidney disease) stage 3, GFR 30-59 ml/min (HCC) 11/17/2015  . Essential hypertension 11/17/2015  . Left foot infection 03/28/2017   . Mixed hyperlipidemia 11/17/2015  . PAD (peripheral artery disease) ([place]) 03/29/2017    Patient Active Problem List   Diagnosis Date Noted  . Neoplasm of uncertain behavior 82/42/3536  . Peripheral vascular disease ([place]) 04/15/2017  . Acute metabolic encephalopathy 14/43/1540  . Hematuria 04/15/2017  . Acute blood loss anemia 04/15/2017  . Coronary artery disease 04/15/2017  . Chronic kidney disease, stage III (moderate) ([place]) 04/15/2017  . Superficial thrombophlebitis 04/15/2017  . Mixed dyslipidemia 04/15/2017  . Osteomyelitis of toe of left foot ([place]) 04/14/2017    Past Surgical History:  Procedure Laterality Date  . CATARACT EXTRACTION    . CORONARY ANGIOPLASTY  1994  . CYSTOSCOPY WITH CLOT EVACUATION/FULGURATION  02/2017  . FOOT DEBRIDEMENT WITH 4TH RAY AMPUTATION Left 03/2017  . KNEE ARTHROSCOPY Right 12/2006  . KNEE ARTHROSCOPY Left 11/2007 and 12/2008       Home Medications    Prior to Admission medications   Medication Sig Start Date End Date Taking? Authorizing Provider  acetaminophen (TYLENOL) 325 MG tablet Take 650 mg by mouth every 6 (six) hours as needed for mild pain or moderate pain.   Yes [provider]  Amino Acids-Protein Hydrolys (FEEDING SUPPLEMENT, PRO-STAT SUGAR FREE 64,) LIQD Take 30 mLs by mouth daily.   Yes [provider]  amoxicillin-clavulanate (AUGMENTIN) 875-125 MG tablet Take 1 tablet by mouth 2 (two) times daily.  04/12/17 04/25/17 Yes [provider]  aspirin 81 MG chewable tablet Chew 81 mg by mouth daily.  Yes [provider]  clotrimazole-betamethasone (LOTRISONE) cream Apply 1 application topically 2 (two) times daily. The cream also has Dipropionate Apply to perineum and groin   Yes [provider]  ferrous sulfate 325 (65 FE) MG tablet Take 325 mg by mouth 2 (two) times daily with a meal.   Yes [provider]  folic acid (FOLVITE) 1 MG tablet Take 1 mg by mouth daily.   Yes  [provider]  HYDROcodone-acetaminophen (NORCO/VICODIN) 5-325 MG tablet Take 1 tablet by mouth every 6 (six) hours as needed for severe pain.   Yes [provider]  lisinopril (PRINIVIL,ZESTRIL) 5 MG tablet Take 5 mg by mouth daily.   Yes [provider]  metoprolol succinate (TOPROL-XL) 50 MG 24 hr tablet Take 50 mg by mouth daily. Take with or immediately following a meal.   Yes [provider]  nitroGLYCERIN (NITROSTAT) 0.4 MG SL tablet Place 0.4 mg under the tongue every 5 (five) minutes as needed for chest pain.   Yes [provider]  NUTRITIONAL SUPPLEMENT LIQD Take 120 mLs by mouth. [employer]   Yes [provider]  pravastatin (PRAVACHOL) 20 MG tablet Take 20 mg by mouth daily.   Yes [provider]  senna-docusate (SENOKOT-S) 8.6-50 MG tablet Take 2 tablets by mouth at bedtime.   Yes [provider]  tamsulosin (FLOMAX) 0.4 MG CAPS capsule Take 0.4 mg by mouth daily.   Yes [provider]  thiamine 100 MG tablet Take 100 mg by mouth daily.   Yes [provider]    Family History Family History  Problem Relation Age of Onset  . GI problems Mother   . Breast cancer Sister   . Irritable bowel syndrome Sister   . Lymphoma Sister   . Sudden death Brother     Social History Social History   Tobacco Use  . Smoking status: Never Smoker  . Smokeless tobacco: Never Used  Substance Use Topics  . Alcohol use: Yes    Alcohol/week: 5.4 oz    Types: 4 Cans of beer, 5 Glasses of wine per week  . Drug use: Not on file     Allergies   Patient has no known allergies.   Review of Systems Review of Systems  Constitutional: Negative for chills and fever.  HENT: Negative for congestion and sore throat.   Eyes: Negative for visual disturbance.  Respiratory: Negative for cough and shortness of breath.   Cardiovascular: Negative for chest pain.  Gastrointestinal: Negative for abdominal pain,  diarrhea, nausea and vomiting.  Genitourinary: Positive for hematuria. Negative for decreased urine volume, dysuria, flank pain, frequency, penile pain, scrotal swelling, testicular pain and urgency.  Musculoskeletal: Negative for back pain and neck pain.  Skin: Negative for rash.  Neurological: Negative for syncope, light-headedness and headaches.     Physical Exam Updated Vital Signs BP (!) 120/56   Pulse 60   Temp 97.9 F (36.6 C) (Oral)   Resp 19   Ht 5\' 9"  (1.753 m)   Wt 80.7 kg (178 lb)   SpO2 98%   BMI 26.29 kg/m   Physical Exam  Constitutional: He appears well-developed and well-nourished. No distress.  Nontoxic-appearing.  HENT:  Head: Normocephalic and atraumatic.  Mouth/Throat: Oropharynx is clear and moist.  Eyes: Right eye exhibits no discharge. Left eye exhibits no discharge.  Neck: Neck supple.  Cardiovascular: Normal rate, regular rhythm, normal heart sounds and intact distal pulses. Exam reveals no gallop and no friction rub.  No murmur heard. Pulmonary/Chest: Effort normal and breath sounds normal. No respiratory distress. He has no wheezes. He has no rales.  Abdominal: Soft. There is no tenderness. There is no guarding.  Genitourinary: No penile tenderness.  Genitourinary Comments: Foley catheter in place.  Frank hematuria in bag. No penile or scrotal TTP.   Musculoskeletal: He exhibits no edema.  Lymphadenopathy:    He has no cervical adenopathy.  Neurological: He is alert. Coordination normal.  Skin: Skin is warm and dry. No rash noted. He is not diaphoretic. No erythema. No pallor.  Psychiatric: He has a normal mood and affect. His behavior is normal.  Nursing note and vitals reviewed.    ED Treatments / Results  Labs (all labs ordered are listed, but only abnormal results are displayed) Labs Reviewed  URINALYSIS, ROUTINE W REFLEX MICROSCOPIC - Abnormal; Notable for the following components:      Result Value   Color, Urine RED (*)     APPearance TURBID (*)    Glucose, UA   (*)    Value: TEST NOT REPORTED DUE TO COLOR INTERFERENCE OF URINE PIGMENT   Hgb urine dipstick   (*)    Value: TEST NOT REPORTED DUE TO COLOR INTERFERENCE OF URINE PIGMENT   Bilirubin Urine   (*)    Value: TEST NOT REPORTED DUE TO COLOR INTERFERENCE OF URINE PIGMENT   Ketones, ur   (*)    Value: TEST NOT REPORTED DUE TO COLOR INTERFERENCE OF URINE PIGMENT   Protein, ur   (*)    Value: TEST NOT REPORTED DUE TO COLOR INTERFERENCE OF URINE PIGMENT   Nitrite   (*)    Value: TEST NOT REPORTED DUE TO COLOR INTERFERENCE OF URINE PIGMENT   Leukocytes, UA   (*)    Value: TEST NOT REPORTED DUE TO COLOR INTERFERENCE OF URINE PIGMENT   All other components within normal limits  COMPREHENSIVE METABOLIC PANEL - Abnormal; Notable for the following components:   Glucose, Bld 112 (*)    BUN 41 (*)    Creatinine, Ser 1.45 (*)    Total Protein 6.0 (*)    Albumin 3.4 (*)    GFR calc non Af Amer 40 (*)    GFR calc Af Amer 46 (*)    All other components within normal limits  CBC WITH DIFFERENTIAL/PLATELET - Abnormal; Notable for the following components:   WBC 14.3 (*)    RBC 2.38 (*)    Hemoglobin 7.9 (*)    HCT 24.5 (*)    MCV 102.9 (*)    Neutro Abs 12.1 (*)    All other components within normal limits  URINALYSIS, MICROSCOPIC (REFLEX) - Abnormal; Notable for the following components:   Bacteria, UA RARE (*)    Squamous Epithelial / LPF 0-5 (*)    All other components within normal limits  URINE CULTURE  CULTURE, BLOOD (ROUTINE X 2)  CULTURE, BLOOD (ROUTINE X 2)  TYPE AND SCREEN  PREPARE RBC (CROSSMATCH)  ABO/RH    EKG  EKG Interpretation None       Radiology Ct Renal Stone Study  Result Date: 04/16/2017 CLINICAL DATA:  Hematuria for 4 days. Listed history of cystoscopy with clot evacuation the last month. EXAM: CT ABDOMEN AND PELVIS WITHOUT CONTRAST TECHNIQUE: Multidetector CT imaging of the abdomen and pelvis was performed following the  standard protocol without IV contrast. COMPARISON:  None. FINDINGS: Lower chest: Large hiatal hernia, majority of the stomach is intrathoracic. No evidence of gastric inflammation. Normal  heart size with coronary artery calcifications. Probable intrapulmonary lymph node in the middle lobe. Punctate granuloma in the left lower lobe. Hepatobiliary: Subcentimeter hypodensities in the liver are too small to characterize, scattered calcified granuloma. Calcified gallstones within physiologically distended gallbladder. No pericholecystic inflammation or biliary dilatation. Pancreas: No ductal dilatation or inflammation. Spleen: Spleen is normal in size. No focal abnormality on noncontrast exam. Adrenals/Urinary Tract: No adrenal nodule. Thinning of bilateral renal parenchyma. Lobular soft tissue fullness of the anterior left kidney, incompletely characterized without contrast, may reflect prominent renal tissue versus solid mass. No hydronephrosis or urolithiasis. Foley catheter in the urinary bladder, balloon likely in the prostatic urethra. Bladder filled with heterogeneous lobular material. This is questionably contiguous with the left anterior bladder wall, otherwise no bladder wall attachments are seen. Mild diffuse bladder wall thickening. Probable small right anterior bladder diverticulum. Mild perivesicular stranding. Stomach/Bowel: Large hiatal hernia, majority of the stomach is intrathoracic. Umbilical hernia contains nonobstructed noninflamed small bowel. No bowel inflammation or wall thickening. Multifocal colonic diverticulosis without diverticulitis. Moderate colonic stool burden and tortuosity. Stool distends the rectum. Normal appendix. Vascular/Lymphatic: Aortic and branch atherosclerosis, advanced. No aneurysm. No definite enlarged abdominal or pelvic lymph nodes. Reproductive: Foley catheter balloon in the prostatic urethra. Other: No free air or ascites. Umbilical hernia containing nonobstructed small  bowel. Musculoskeletal: There are no acute or suspicious osseous abnormalities. Multilevel degenerative change in the lumbar spine. Prominent Schmorl's node superior endplate of L4. IMPRESSION: 1. Bladder filled with large amount of heterogeneous lobular material, likely hemorrhage/blood clot. Focal mass fell less likely, however there is questionable continuity with left bladder wall. Recommend correlation with recent cystoscopy. 2. Foley catheter balloon in the prostatic urethra. 3. Lobular soft tissue prominence of the anterior left kidney, likely lobular renal tissue, focal mass difficult to exclude without contrast. 4. Chronic findings not related to hematuria included large hiatal hernia, cholelithiasis, colonic diverticulosis, and advanced aortic atherosclerosis. 5. Umbilical hernia containing nonobstructed noninflamed small bowel. Aortic Atherosclerosis (ICD10-I70.0). Electronically Signed   By: Jeb Levering M.D.   On: 04/16/2017 23:07    Procedures Procedures (including critical care time)  CRITICAL CARE Performed by: Hanley Hays   Total critical care time: 45  minutes  Critical care time was exclusive of separately billable procedures and treating other patients.  Critical care was necessary to treat or prevent imminent or life-threatening deterioration.  Critical care was time spent personally by me on the following activities: development of treatment plan with patient and/or surrogate as well as nursing, discussions with consultants, evaluation of patient's response to treatment, examination of patient, obtaining history from patient or surrogate, ordering and performing treatments and interventions, ordering and review of laboratory studies, ordering and review of radiographic studies, pulse oximetry and re-evaluation of patient's condition.   Medications Ordered in ED Medications  0.9 %  sodium chloride infusion (not administered)  cefTRIAXone (ROCEPHIN) 1 g in  dextrose 5 % 50 mL IVPB (0 g Intravenous Stopped 04/16/17 2116)     Initial Impression / Assessment and Plan / ED Course  I have reviewed the triage vital signs and the nursing notes.  Pertinent labs & imaging results that were available during my care of the patient were reviewed by me and considered in my medical decision making (see chart for details).    This  is a 83 y.o. Male who presents to the ED from his SNF with hematuria starting yesterday.  Patient has a chronic indwelling Foley catheter after some problems with urinary retention  recently.  He had an episode of hematuria after traumatic insertion earlier this month.  He had a cystoscopy by urology on 04/10/2017 which showed no evidence of tumor or bleeding.  He has been staying at the skilled nursing facility for treatment for osteomyelitis of his toe and is currently on Augmentin until 04/25/2017.  Family reports is been compliant with his medication.  They report yesterday and today they noticed frank hematuria from his Foley catheter.  He has had no abdominal pain, nausea or vomiting.  He denies any lightheadedness or dizziness.  No chest pain or shortness of breath.  He denies any pain to his penis or his testicles.  Urine amount has been normal.  He is not on any blood thinners.  On exam the patient is afebrile nontoxic-appearing.  His abdomen is soft and nontender to palpation.  Foley catheter is in place.  Frank hematuria. CMP shows a mildly elevated creatinine 1.45.  There is an increase from his baseline around 1.20.  CBC is remarkable for a white count of 14,000.  Hemoglobin of 7.9.  Patient's heme globin trending downward.  Hemoglobin of 11 and November of this year.  Urinalysis shows too numerous to count white blood cells and too numerous to count red blood cells.  Question hemorrhagic cystitis. Plan for bladder irrigation and discuss plan for blood transfusion.  Patient agrees with this plan.  Discussed the risks and benefits  with blood transfusion.  As he has continuously dropped his hemoglobin we do not want at this to progress further.  He will need his CBC trended and treatment for UTI.  Rocephin given in the emergency department.  I consulted with tried hospitalist Dr. Tamala Julian who would like me to obtain a CT renal stone study prior to admission.  CT renal stone study shows a bladder filled with a large amount blood. It also shows that the foley catheter balloon is in the prostatic urethra. Unsure where this foley cath was inserted. It was not inserted at this facility. Patient is unsure.   Consult with urologist Dr. Diona Fanti who would like [place] to try and insert a 22 fr foley cath to help this drain. He reports we could also try a 20 fr if we have trouble. If we cannot get this in he would like [place] to call him back and he can come in and place it. He will see the patient in consult in the AM otherwise. Nursing working on getting foley cath now.   Dr. Tamala Julian from [facility] accepted the patient for admission.   This patient was discussed with and evaluated by Dr. Kathrynn Humble who agrees with assessment and plan.   Final Clinical Impressions(s) / ED Diagnoses   Final diagnoses:  Hemorrhagic cystitis  Anemia, unspecified type    ED Discharge Orders    None       Waynetta Pean, PA-C 04/17/17 [id number]    Varney Biles, MD 04/17/17 [id number]

## 2017-04-16 NOTE — ED Triage Notes (Signed)
Per EMS pt came from [facility] developed blood in urine about 4 days ago that cleared up on its own.  Today his son went to see him and noticed that he had blood in his urine today.  The facility called EMS.  He is AOx4 NAD noted at this time. Does have a foley in place with bright red urine in line and bag.

## 2017-04-16 NOTE — Assessment & Plan Note (Signed)
Dermatology F/U post resolution of osteomyelitis & GU issues

## 2017-04-17 ENCOUNTER — Inpatient Hospital Stay (HOSPITAL_COMMUNITY): Payer: Medicare Other

## 2017-04-17 DIAGNOSIS — D72829 Elevated white blood cell count, unspecified: Secondary | ICD-10-CM | POA: Diagnosis present

## 2017-04-17 DIAGNOSIS — R338 Other retention of urine: Secondary | ICD-10-CM | POA: Diagnosis present

## 2017-04-17 DIAGNOSIS — Z7982 Long term (current) use of aspirin: Secondary | ICD-10-CM | POA: Diagnosis not present

## 2017-04-17 DIAGNOSIS — I739 Peripheral vascular disease, unspecified: Secondary | ICD-10-CM | POA: Diagnosis present

## 2017-04-17 DIAGNOSIS — Z807 Family history of other malignant neoplasms of lymphoid, hematopoietic and related tissues: Secondary | ICD-10-CM | POA: Diagnosis not present

## 2017-04-17 DIAGNOSIS — M86672 Other chronic osteomyelitis, left ankle and foot: Secondary | ICD-10-CM | POA: Diagnosis present

## 2017-04-17 DIAGNOSIS — I129 Hypertensive chronic kidney disease with stage 1 through stage 4 chronic kidney disease, or unspecified chronic kidney disease: Secondary | ICD-10-CM | POA: Diagnosis present

## 2017-04-17 DIAGNOSIS — Y846 Urinary catheterization as the cause of abnormal reaction of the patient, or of later complication, without mention of misadventure at the time of the procedure: Secondary | ICD-10-CM | POA: Diagnosis present

## 2017-04-17 DIAGNOSIS — E782 Mixed hyperlipidemia: Secondary | ICD-10-CM | POA: Diagnosis present

## 2017-04-17 DIAGNOSIS — Z9861 Coronary angioplasty status: Secondary | ICD-10-CM | POA: Diagnosis not present

## 2017-04-17 DIAGNOSIS — N183 Chronic kidney disease, stage 3 (moderate): Secondary | ICD-10-CM | POA: Diagnosis present

## 2017-04-17 DIAGNOSIS — N400 Enlarged prostate without lower urinary tract symptoms: Secondary | ICD-10-CM | POA: Diagnosis present

## 2017-04-17 DIAGNOSIS — N3289 Other specified disorders of bladder: Secondary | ICD-10-CM | POA: Diagnosis present

## 2017-04-17 DIAGNOSIS — T83028A Displacement of other indwelling urethral catheter, initial encounter: Secondary | ICD-10-CM | POA: Diagnosis present

## 2017-04-17 DIAGNOSIS — N401 Enlarged prostate with lower urinary tract symptoms: Secondary | ICD-10-CM | POA: Diagnosis present

## 2017-04-17 DIAGNOSIS — R310 Gross hematuria: Secondary | ICD-10-CM | POA: Diagnosis present

## 2017-04-17 DIAGNOSIS — N3091 Cystitis, unspecified with hematuria: Secondary | ICD-10-CM | POA: Diagnosis present

## 2017-04-17 DIAGNOSIS — D62 Acute posthemorrhagic anemia: Secondary | ICD-10-CM | POA: Diagnosis present

## 2017-04-17 DIAGNOSIS — F0390 Unspecified dementia without behavioral disturbance: Secondary | ICD-10-CM | POA: Diagnosis present

## 2017-04-17 LAB — CBC
HEMATOCRIT: 25.2 % — AB (ref 39.0–52.0)
HEMATOCRIT: 29.8 % — AB (ref 39.0–52.0)
HEMOGLOBIN: 9.7 g/dL — AB (ref 13.0–17.0)
Hemoglobin: 8.1 g/dL — ABNORMAL LOW (ref 13.0–17.0)
MCH: 31.9 pg (ref 26.0–34.0)
MCH: 31 pg (ref 26.0–34.0)
MCHC: 32.1 g/dL (ref 30.0–36.0)
MCHC: 32.6 g/dL (ref 30.0–36.0)
MCV: 99.2 fL (ref 78.0–100.0)
MCV: 95.2 fL (ref 78.0–100.0)
Platelets: 136 10*3/uL — ABNORMAL LOW (ref 150–400)
Platelets: 137 10*3/uL — ABNORMAL LOW (ref 150–400)
RBC: 2.54 MIL/uL — ABNORMAL LOW (ref 4.22–5.81)
RBC: 3.13 MIL/uL — AB (ref 4.22–5.81)
RDW: 17.5 % — AB (ref 11.5–15.5)
RDW: 19.1 % — ABNORMAL HIGH (ref 11.5–15.5)
WBC: 14.7 10*3/uL — ABNORMAL HIGH (ref 4.0–10.5)
WBC: 16.7 10*3/uL — AB (ref 4.0–10.5)

## 2017-04-17 LAB — BASIC METABOLIC PANEL
Anion gap: 7 (ref 5–15)
BUN: 35 mg/dL — AB (ref 6–20)
CALCIUM: 8.8 mg/dL — AB (ref 8.9–10.3)
CHLORIDE: 109 mmol/L (ref 101–111)
CO2: 21 mmol/L — AB (ref 22–32)
CREATININE: 1.25 mg/dL — AB (ref 0.61–1.24)
GFR calc non Af Amer: 48 mL/min — ABNORMAL LOW (ref >60)
GFR, EST AFRICAN AMERICAN: 55 mL/min — AB (ref >60)
Glucose, Bld: 104 mg/dL — ABNORMAL HIGH (ref 65–99)
Potassium: 3.6 mmol/L (ref 3.5–5.1)
SODIUM: 137 mmol/L (ref 135–145)

## 2017-04-17 LAB — URINE CULTURE: Culture: NO GROWTH

## 2017-04-17 LAB — PREPARE RBC (CROSSMATCH)

## 2017-04-17 MED ORDER — PRAVASTATIN SODIUM 20 MG PO TABS
20.0000 mg | ORAL_TABLET | Freq: Every day | ORAL | Status: DC
Start: 2017-04-17 — End: 2017-04-19
  Administered 2017-04-17 – 2017-04-18 (×2): 20 mg via ORAL
  Filled 2017-04-17 (×3): qty 1

## 2017-04-17 MED ORDER — AMOXICILLIN-POT CLAVULANATE 875-125 MG PO TABS
1.0000 | ORAL_TABLET | Freq: Two times a day (BID) | ORAL | Status: DC
  Administered 2017-04-17 – 2017-04-19 (×6): 1 via ORAL
  Filled 2017-04-17 (×6): qty 1

## 2017-04-17 MED ORDER — BOOST / RESOURCE BREEZE PO LIQD CUSTOM
1.0000 | Freq: Three times a day (TID) | ORAL | Status: DC
  Administered 2017-04-17 – 2017-04-18 (×3): 1 via ORAL

## 2017-04-17 MED ORDER — LISINOPRIL 5 MG PO TABS
5.0000 mg | ORAL_TABLET | Freq: Every day | ORAL | Status: DC
  Administered 2017-04-17 – 2017-04-19 (×3): 5 mg via ORAL
  Filled 2017-04-17 (×3): qty 1

## 2017-04-17 MED ORDER — VITAMIN B-1 100 MG PO TABS
100.0000 mg | ORAL_TABLET | Freq: Every day | ORAL | Status: DC
  Administered 2017-04-17 – 2017-04-19 (×3): 100 mg via ORAL
  Filled 2017-04-17 (×3): qty 1

## 2017-04-17 MED ORDER — SENNOSIDES-DOCUSATE SODIUM 8.6-50 MG PO TABS
2.0000 | ORAL_TABLET | Freq: Every day | ORAL | Status: DC
  Administered 2017-04-17 (×2): 2 via ORAL
  Filled 2017-04-17 (×3): qty 2

## 2017-04-17 MED ORDER — METOPROLOL SUCCINATE ER 50 MG PO TB24
50.0000 mg | ORAL_TABLET | Freq: Every day | ORAL | Status: DC
  Administered 2017-04-17 – 2017-04-19 (×3): 50 mg via ORAL
  Filled 2017-04-17 (×3): qty 1

## 2017-04-17 MED ORDER — CLOTRIMAZOLE 1 % EX CREA
TOPICAL_CREAM | Freq: Two times a day (BID) | CUTANEOUS | Status: DC
  Administered 2017-04-17 – 2017-04-18 (×4): via TOPICAL
  Filled 2017-04-17 (×2): qty 15

## 2017-04-17 MED ORDER — NUTRITIONAL SUPPLEMENT PO LIQD: 120.0000 mL | Freq: Three times a day (TID) | ORAL | Status: DC

## 2017-04-17 MED ORDER — TAMSULOSIN HCL 0.4 MG PO CAPS
0.4000 mg | ORAL_CAPSULE | Freq: Every day | ORAL | Status: DC
  Administered 2017-04-17 – 2017-04-19 (×3): 0.4 mg via ORAL
  Filled 2017-04-17 (×3): qty 1

## 2017-04-17 MED ORDER — DEXTROSE 5 % IV SOLN
1.0000 g | INTRAVENOUS | Status: DC
  Administered 2017-04-17: 1 g via INTRAVENOUS
  Filled 2017-04-17: qty 10

## 2017-04-17 MED ORDER — ONDANSETRON HCL 4 MG PO TABS: 4.0000 mg | ORAL_TABLET | Freq: Four times a day (QID) | ORAL | Status: DC | PRN

## 2017-04-17 MED ORDER — FERROUS SULFATE 325 (65 FE) MG PO TABS
325.0000 mg | ORAL_TABLET | Freq: Two times a day (BID) | ORAL | Status: DC
  Administered 2017-04-17 – 2017-04-19 (×5): 325 mg via ORAL
  Filled 2017-04-17 (×6): qty 1

## 2017-04-17 MED ORDER — IPRATROPIUM-ALBUTEROL 0.5-2.5 (3) MG/3ML IN SOLN: 3.0000 mL | Freq: Four times a day (QID) | RESPIRATORY_TRACT | Status: DC | PRN

## 2017-04-17 MED ORDER — ACETAMINOPHEN 325 MG PO TABS
650.0000 mg | ORAL_TABLET | Freq: Four times a day (QID) | ORAL | Status: DC | PRN
  Administered 2017-04-18: 650 mg via ORAL
  Filled 2017-04-17: qty 2

## 2017-04-17 MED ORDER — ASPIRIN 81 MG PO CHEW
81.0000 mg | CHEWABLE_TABLET | Freq: Every day | ORAL | Status: DC
  Administered 2017-04-17: 81 mg via ORAL
  Filled 2017-04-17: qty 1

## 2017-04-17 MED ORDER — ACETAMINOPHEN 650 MG RE SUPP: 650.0000 mg | Freq: Four times a day (QID) | RECTAL | Status: DC | PRN

## 2017-04-17 MED ORDER — HYDROCODONE-ACETAMINOPHEN 5-325 MG PO TABS
1.0000 | ORAL_TABLET | ORAL | Status: AC
  Administered 2017-04-17: 1 via ORAL
  Filled 2017-04-17: qty 1

## 2017-04-17 MED ORDER — FOLIC ACID 1 MG PO TABS
1.0000 mg | ORAL_TABLET | Freq: Every day | ORAL | Status: DC
  Administered 2017-04-18 – 2017-04-19 (×2): 1 mg via ORAL
  Filled 2017-04-17 (×2): qty 1

## 2017-04-17 MED ORDER — ACETAMINOPHEN 325 MG PO TABS: 650.0000 mg | ORAL_TABLET | Freq: Four times a day (QID) | ORAL | Status: DC | PRN

## 2017-04-17 MED ORDER — ONDANSETRON HCL 4 MG/2ML IJ SOLN
4.0000 mg | Freq: Four times a day (QID) | INTRAMUSCULAR | Status: DC | PRN
Start: 2017-04-17 — End: 2017-04-19

## 2017-04-17 MED ORDER — SODIUM CHLORIDE 0.9 % IV SOLN
Freq: Once | INTRAVENOUS | Status: AC
  Administered 2017-04-17: 12:00:00 via INTRAVENOUS

## 2017-04-17 MED ORDER — HYDROCODONE-ACETAMINOPHEN 5-325 MG PO TABS
1.0000 | ORAL_TABLET | Freq: Four times a day (QID) | ORAL | Status: DC | PRN
  Administered 2017-04-17: 1 via ORAL
  Filled 2017-04-17: qty 1

## 2017-04-17 NOTE — Progress Notes (Signed)
3 way 38F Irrigation Catheter was placed by me following sterile procedure.  Catheter was inserted without difficulty, + return of bloody urine with clots, 800cc came out initially, CBI tubing was connected and irrigation was started. Patient stated he felt better and tolerated procedure well.

## 2017-04-17 NOTE — ED Notes (Signed)
Urine output remains bloody in appearance.

## 2017-04-17 NOTE — Consult Note (Signed)
Urology Consult  Consulting MD: Fuller Plan, MD  CC: Blood in urine  HPI: This is a 83year old male presenting to the emergency room with gross hematuria.  The patient does have a history of recent hematuria.  Apparently, recently the patient had urinary retention.  Foley catheter was placed on November 30.  Following this, the patient was noted to have gross hematuria.  He was placed on continuous bladder irrigation.  The patient removed his catheter on 12/12 with the balloon up secondary to delirium.  He had bladder irrigation performed at that time.  He had a cystoscopy performed by Dr. Vernard Gambles on 12/13.  At that time, there was a large clot in the bladder.  The patient was transferred to the emergency room today because of recurrent hematuria.  He was noted to have a hemoglobin of 7.9.  By history, the patient's hemoglobin was 8.2 on 12/14.  A Foley catheter was in place at the time of his presentation to the emergency room.  CT scan had been performed for evaluation in the emergency room, revealing the balloon of the catheter inflated in the patient's prostatic urethra.  The catheter was subsequently replaced with a 20 [place] three-way catheter.  The catheter did drain appropriately, significant bloody urine, but he was having significant pain after this.  Urologic consultation is requested.  PMH: Past Medical History:  Diagnosis Date  . CKD (chronic kidney disease) stage 3, GFR 30-59 ml/min (HCC) 11/17/2015  . Essential hypertension 11/17/2015  . Left foot infection 03/28/2017  . Mixed hyperlipidemia 11/17/2015  . PAD (peripheral artery disease) ([place]) 03/29/2017    PSH: Past Surgical History:  Procedure Laterality Date  . CATARACT EXTRACTION    . CORONARY ANGIOPLASTY  1994  . CYSTOSCOPY WITH CLOT EVACUATION/FULGURATION  02/2017  . FOOT DEBRIDEMENT WITH 4TH RAY AMPUTATION Left 03/2017  . KNEE ARTHROSCOPY Right 12/2006  . KNEE ARTHROSCOPY Left 11/2007 and 12/2008    Allergies: No  Known Allergies  Medications:  (Not in a hospital admission)   Social History: Social History   Socioeconomic History  . Marital status: Unknown    Spouse name: Not on file  . Number of children: Not on file  . Years of education: Not on file  . Highest education level: Not on file  Social Needs  . Financial resource strain: Not on file  . Food insecurity - worry: Not on file  . Food insecurity - inability: Not on file  . Transportation needs - medical: Not on file  . Transportation needs - non-medical: Not on file  Occupational History  . Not on file  Tobacco Use  . Smoking status: Never Smoker  . Smokeless tobacco: Never Used  Substance and Sexual Activity  . Alcohol use: Yes    Alcohol/week: 5.4 oz    Types: 4 Cans of beer, 5 Glasses of wine per week  . Drug use: Not on file  . Sexual activity: Not on file  Other Topics Concern  . Not on file  Social History Narrative  . Not on file    Family History: Family History  Problem Relation Age of Onset  . GI problems Mother   . Breast cancer Sister   . Irritable bowel syndrome Sister   . Lymphoma Sister   . Sudden death Brother     Review of Systems: Positive: Bladder pain, gross hematuria Negative: None A further 10 point review of systems was negative except what is listed in the HPI.  Physical Exam: @  [id number]@ General: No acute distress.  Awake. Head:  Normocephalic.  Atraumatic. ENT:  EOMI.  Mucous membranes moist Neck:  Supple.  No lymphadenopathy. CV:  S1 present. S2 present. Regular rate. Pulmonary: Equal effort bilaterally.  Clear to auscultation bilaterally. Abdomen: Soft.  None tender to palpation. Skin:  Normal turgor.  No visible rash. Extremity: No gross deformity of bilateral upper extremities.  No gross deformity of    bilateral lower extremities. Neurologic: Alert. Appropriate mood.  Penis:  No lesions. Urethra:  Foley catheter in place.  Orthotopic meatus. Scrotum: No lesions.  No  ecchymosis.  No erythema. Testicles: Descended bilaterally.  No masses bilaterally. Epididymis: Palpable bilaterally.  Non Tender to palpation.  Studies:  Recent Labs    04/16/17 1747  HGB 7.9*  WBC 14.3*  PLT 152    Recent Labs    04/16/17 1747  NA 136  K 4.1  CL 107  CO2 22  BUN 41*  CREATININE 1.45*  CALCIUM 9.1  GFRNONAA 40*  GFRAA 46*     No results for input(s): INR, APTT in the last 72 hours.  Invalid input(s): PT   Invalid input(s): ABG  Observation of the patients catheter reveals that it appears to be properly positioned.  The catheter was irrigated with 700 cc of saline.  Several clots were evacuated.  He was irrigated until clear.  This was then replaced to CBI.  Assessment: Gross hematuria, most likely secondary to malposition of the patient's Foley catheter.  This may be secondary to the patient's cognitive impairment and attempts to remove the catheter.  Plan: 1.  Make sure the patient stays off anticoagulant medication  2.  For now, continue CBI slowly.  3.  Catheter balloon filled with 30 cc of saline.  Keep the patient's catheter balloon filled with at least 30 cc of water.  This will prevent him dislodging it into the urethra if he pulls on it  4.  As the urine clears, it is okay to DC the CBI and plugged the irrigation port  5.  Leave Foley in long-term, he is to follow-up with Dr. Vernard Gambles in the [place].  Reconsult urology as needed.    Pager:[phone]

## 2017-04-17 NOTE — ED Notes (Signed)
Dr. Tamala Julian has paged urology who will come in to eval pt

## 2017-04-17 NOTE — ED Notes (Signed)
Old Foley Catheter removed, replaced with  20Fr 3way Foley. Concern for correct placement d/t pt being in extreme pain. Minimal output is also noted, but the output that is noted is not as bloody.

## 2017-04-17 NOTE — ED Notes (Signed)
Patient anxious. Determined to have BM. On bedpan, but unsuccessful. Urinary irrigation still in process. Urine becoming pink tinged.

## 2017-04-17 NOTE — Progress Notes (Addendum)
PROGRESS NOTE    Jason Calderon  KCL:[id number] DOB: 26-Sep-1921 DOA: 04/16/2017 PCP: Ivan Anchors, MD     Brief Narrative:  Jason Calderon is a 83 y.o. male with medical history significant of osteomyelitis of the left foot s/p left 4th digit amputation, dementia, PAD, BPH with urinary retention s/p Foley catheter; who presents with complaints of bloody urine.  Patient was just seen at [facility] for the same thought to be secondary to a traumatic Foley catheter insertion.  At that time he underwent cystoscopy with clot evacuation and fulguration.  There is no note of any significant masses or abnormalities noted at that time.  Bleeding had apparently cleared following discharge, but the patient was noted to again have bloody urine in his Foley bag when seen by his son on day of admission. CT scan found to reveal that the Foley balloon was in the prostatic urethra.  Dr. Diona Fanti of urology was consulted and recommended replacing Foley catheter.    Assessment & Plan:   Principal Problem:   Hematuria Active Problems:   Osteomyelitis of toe of left foot (HCC)   Acute blood loss anemia   Chronic kidney disease, stage III (moderate) (HCC)   Leukocytosis   BPH (benign prostatic hyperplasia)   Hematuria with acute blood loss anemia -Foley placed 11/30 due to urinary retention, developed hematuria and placed on continuous bladder irrigation. Catheter accidentally removed during episode of delirium on 12/12, cystoscopy with Dr. Vernard Gambles on 12/13 which showed large clot in the bladder.  On admission, CT scan of the abdomen revealed signs of a blood clot within the bladder with Foley catheter balloon in the prostatic urethra -Urology consulted.  Continue continuous bladder irrigation until urine clears.  Will need to follow-up with Dr. Vernard Gambles on discharge. -Blood transfusions ordered overnight   UTI, POA, catheter related  -Follow-up urine culture -Continue empiric antibiotics of Rocephin  Chronic  kidney disease stage III -Stable   Chronic osteomyelitis of the left toe -Continue Augmentin with stop date 12/28  Essential hypertension -Continue metoprolol lisinopril  Hyperlipidemia -Continue Statin  BPH with urinary retention  -Continue Flomax  Dementia -Stable     DVT prophylaxis: SCD Code Status: Full Family Communication: Son over the phone Disposition Plan: Pending improvement in hematuria, back to [employer] on discharge   Consultants:   Urology  Procedures:   None   Antimicrobials:  Anti-infectives (From admission, onward)   Start     Dose/Rate Route Frequency Ordered Stop   04/17/17 2200  cefTRIAXone (ROCEPHIN) 1 g in dextrose 5 % 50 mL IVPB     1 g 100 mL/hr over 30 Minutes Intravenous Every 24 hours 04/17/17 0203     04/17/17 0115  amoxicillin-clavulanate (AUGMENTIN) 875-125 MG per tablet 1 tablet     1 tablet Oral 2 times daily 04/17/17 0114 04/25/17 2159   04/16/17 2030  cefTRIAXone (ROCEPHIN) 1 g in dextrose 5 % 50 mL IVPB     1 g 100 mL/hr over 30 Minutes Intravenous  Once 04/16/17 2017 04/16/17 2116       Subjective: Poor ROS due to dementia   Objective: Vitals:   04/17/17 1117 04/17/17 1120 04/17/17 1159 04/17/17 1230  BP: (!) 120/46 (!) 120/46 (!) 197/91 (!) 195/82  Pulse: 62 70 72 80  Resp: 16  20 (!) 24  Temp:   (!) 97.5 F (36.4 C) 97.9 F (36.6 C)  TempSrc:   Oral Oral  SpO2: 100%  100%   Weight:  Height:        Intake/Output Summary (Last 24 hours) at 04/17/2017 1238 Last data filed at 04/17/2017 1137 Gross per 24 hour  Intake 81.5 ml  Output 3250 ml  Net -3168.5 ml   Filed Weights   04/16/17 1722  Weight: 80.7 kg (178 lb)    Examination:  General exam: Appears calm and comfortable  Respiratory system: Clear to auscultation. Respiratory effort normal. Cardiovascular system: S1 & S2 heard, RRR. No JVD, murmurs, rubs, gallops or clicks. No pedal edema. Gastrointestinal system: Abdomen is nondistended,  soft and nontender. No organomegaly or masses felt. Normal bowel sounds heard. Central nervous system: Alert. No focal neurological deficits. Skin: No rashes, lesions or ulcers Psychiatry: +Dementia   Data Reviewed: I have personally reviewed following labs and imaging studies  CBC: Recent Labs  Lab 04/11/17 04/16/17 1747 04/17/17 0242  WBC  --  14.3* 14.7*  NEUTROABS  --  12.1*  --   HGB 8.2* 7.9* 8.1*  HCT 27* 24.5* 25.2*  MCV  --  102.9* 99.2  PLT  --  152 119*   Basic Metabolic Panel: Recent Labs  Lab 04/11/17 04/16/17 1747 04/17/17 0242  NA 137 136 137  K 5.2 4.1 3.6  CL  --  107 109  CO2  --  22 21*  GLUCOSE  --  112* 104*  BUN 33* 41* 35*  CREATININE 1.2 1.45* 1.25*  CALCIUM  --  9.1 8.8*   GFR: Estimated Creatinine Clearance: 36.9 mL/min (A) (by C-G formula based on SCr of 1.25 mg/dL (H)). Liver Function Tests: Recent Labs  Lab 04/16/17 1747  AST 23  ALT 21  ALKPHOS 71  BILITOT 0.5  PROT 6.0*  ALBUMIN 3.4*   No results for input(s): LIPASE, AMYLASE in the last 168 hours. No results for input(s): AMMONIA in the last 168 hours. Coagulation Profile: No results for input(s): INR, PROTIME in the last 168 hours. Cardiac Enzymes: No results for input(s): CKTOTAL, CKMB, CKMBINDEX, TROPONINI in the last 168 hours. BNP (last 3 results) No results for input(s): PROBNP in the last 8760 hours. HbA1C: No results for input(s): HGBA1C in the last 72 hours. CBG: No results for input(s): GLUCAP in the last 168 hours. Lipid Profile: No results for input(s): CHOL, HDL, LDLCALC, TRIG, CHOLHDL, LDLDIRECT in the last 72 hours. Thyroid Function Tests: No results for input(s): TSH, T4TOTAL, FREET4, T3FREE, THYROIDAB in the last 72 hours. Anemia Panel: No results for input(s): VITAMINB12, FOLATE, FERRITIN, TIBC, IRON, RETICCTPCT in the last 72 hours. Sepsis Labs: No results for input(s): PROCALCITON, LATICACIDVEN in the last 168 hours.  No results found for this or  any previous visit (from the past 240 hour(s)).     Radiology Studies: Dg Chest Port 1 View  Result Date: 04/17/2017 CLINICAL DATA:  Acute anemia. EXAM: PORTABLE CHEST 1 VIEW COMPARISON:  Lung bases from abdominal CT yesterday. FINDINGS: Enlargement of the cardiac silhouette accentuated by low lung volumes a retrocardiac hiatal hernia. No consolidation. No pulmonary edema, pleural effusion or pneumothorax. Calcifications projecting over the right upper arm and lateral hemithorax appear chronic. IMPRESSION: Large cardiac silhouette in part accentuated by large hiatal hernia and low lung volumes. No acute abnormality. Electronically Signed   By: Jeb Levering M.D.   On: 04/17/2017 01:50   Ct Renal Stone Study  Result Date: 04/16/2017 CLINICAL DATA:  Hematuria for 4 days. Listed history of cystoscopy with clot evacuation the last month. EXAM: CT ABDOMEN AND PELVIS WITHOUT CONTRAST TECHNIQUE: Multidetector CT  imaging of the abdomen and pelvis was performed following the standard protocol without IV contrast. COMPARISON:  None. FINDINGS: Lower chest: Large hiatal hernia, majority of the stomach is intrathoracic. No evidence of gastric inflammation. Normal heart size with coronary artery calcifications. Probable intrapulmonary lymph node in the middle lobe. Punctate granuloma in the left lower lobe. Hepatobiliary: Subcentimeter hypodensities in the liver are too small to characterize, scattered calcified granuloma. Calcified gallstones within physiologically distended gallbladder. No pericholecystic inflammation or biliary dilatation. Pancreas: No ductal dilatation or inflammation. Spleen: Spleen is normal in size. No focal abnormality on noncontrast exam. Adrenals/Urinary Tract: No adrenal nodule. Thinning of bilateral renal parenchyma. Lobular soft tissue fullness of the anterior left kidney, incompletely characterized without contrast, may reflect prominent renal tissue versus solid mass. No  hydronephrosis or urolithiasis. Foley catheter in the urinary bladder, balloon likely in the prostatic urethra. Bladder filled with heterogeneous lobular material. This is questionably contiguous with the left anterior bladder wall, otherwise no bladder wall attachments are seen. Mild diffuse bladder wall thickening. Probable small right anterior bladder diverticulum. Mild perivesicular stranding. Stomach/Bowel: Large hiatal hernia, majority of the stomach is intrathoracic. Umbilical hernia contains nonobstructed noninflamed small bowel. No bowel inflammation or wall thickening. Multifocal colonic diverticulosis without diverticulitis. Moderate colonic stool burden and tortuosity. Stool distends the rectum. Normal appendix. Vascular/Lymphatic: Aortic and branch atherosclerosis, advanced. No aneurysm. No definite enlarged abdominal or pelvic lymph nodes. Reproductive: Foley catheter balloon in the prostatic urethra. Other: No free air or ascites. Umbilical hernia containing nonobstructed small bowel. Musculoskeletal: There are no acute or suspicious osseous abnormalities. Multilevel degenerative change in the lumbar spine. Prominent Schmorl's node superior endplate of L4. IMPRESSION: 1. Bladder filled with large amount of heterogeneous lobular material, likely hemorrhage/blood clot. Focal mass fell less likely, however there is questionable continuity with left bladder wall. Recommend correlation with recent cystoscopy. 2. Foley catheter balloon in the prostatic urethra. 3. Lobular soft tissue prominence of the anterior left kidney, likely lobular renal tissue, focal mass difficult to exclude without contrast. 4. Chronic findings not related to hematuria included large hiatal hernia, cholelithiasis, colonic diverticulosis, and advanced aortic atherosclerosis. 5. Umbilical hernia containing nonobstructed noninflamed small bowel. Aortic Atherosclerosis (ICD10-I70.0). Electronically Signed   By: Jeb Levering M.D.    On: 04/16/2017 23:07      Scheduled Meds: . amoxicillin-clavulanate  1 tablet Oral BID  . clotrimazole   Topical BID  . ferrous sulfate  325 mg Oral BID WC  . folic acid  1 mg Oral Daily  . lisinopril  5 mg Oral Daily  . metoprolol succinate  50 mg Oral Daily  . NUTRITIONAL SUPPLEMENT  120 mL Oral TID BM  . pravastatin  20 mg Oral q1800  . senna-docusate  2 tablet Oral QHS  . tamsulosin  0.4 mg Oral Daily  . thiamine  100 mg Oral Daily   Continuous Infusions: . cefTRIAXone (ROCEPHIN)  IV       LOS: 0 days    Time spent: 40 minutes   Dessa Phi, DO [facility] [email or web address] Password [facility] 04/17/2017, 12:38 PM

## 2017-04-17 NOTE — ED Notes (Signed)
Pt has had 2L of irigation per urology he will have one more 1L

## 2017-04-17 NOTE — ED Notes (Signed)
Attempted to call report to 5 [place]. Left name and number for floor RN to return call for report.

## 2017-04-17 NOTE — ED Notes (Signed)
Heart Healthy diet lunch tray ordered @ 1118.

## 2017-04-17 NOTE — ED Notes (Signed)
Breakfast reheated and prepared for patient to eat.

## 2017-04-17 NOTE — ED Notes (Signed)
Heart Healthy diet breakfast tray ordered @ 0522.  

## 2017-04-17 NOTE — Progress Notes (Signed)
Pharmacy Antibiotic Note  Jason Calderon is a 83 y.o. male admitted on 04/16/2017 with hematuria.  Pharmacy has been consulted for Ceftriaxone dosing for possible UTI. WBC 14.3.   Plan: Ceftriaxone 1g IV q24h F/U urine culture for directed therapy  Height: 5\' 9"  (175.3 cm) Weight: 178 lb (80.7 kg) IBW/kg (Calculated) : 70.7  Temp (24hrs), Avg:97.9 F (36.6 C), Min:97.8 F (36.6 C), Max:97.9 F (36.6 C)  Recent Labs  Lab 04/11/17 04/16/17 1747  WBC  --  14.3*  CREATININE 1.2 1.45*    Estimated Creatinine Clearance: 31.8 mL/min (A) (by C-G formula based on SCr of 1.45 mg/dL (H)).    No Known Allergies   Jason Calderon 04/17/2017 2:00 AM

## 2017-04-17 NOTE — ED Notes (Signed)
Also bladder scanned pt after insertion of new Foley, 220ml noted

## 2017-04-17 NOTE — ED Notes (Signed)
Unable to get blood cultures due to pt having blood run right now

## 2017-04-18 ENCOUNTER — Other Ambulatory Visit: Payer: Self-pay

## 2017-04-18 LAB — BASIC METABOLIC PANEL
Anion gap: 12 (ref 5–15)
BUN: 29 mg/dL — ABNORMAL HIGH (ref 6–20)
CHLORIDE: 106 mmol/L (ref 101–111)
CO2: 19 mmol/L — AB (ref 22–32)
Calcium: 9.4 mg/dL (ref 8.9–10.3)
Creatinine, Ser: 1.43 mg/dL — ABNORMAL HIGH (ref 0.61–1.24)
GFR calc non Af Amer: 41 mL/min — ABNORMAL LOW (ref >60)
GFR, EST AFRICAN AMERICAN: 47 mL/min — AB (ref >60)
Glucose, Bld: 124 mg/dL — ABNORMAL HIGH (ref 65–99)
POTASSIUM: 3.9 mmol/L (ref 3.5–5.1)
SODIUM: 137 mmol/L (ref 135–145)

## 2017-04-18 LAB — CBC
HEMATOCRIT: 32.2 % — AB (ref 39.0–52.0)
HEMOGLOBIN: 10.2 g/dL — AB (ref 13.0–17.0)
MCH: 31 pg (ref 26.0–34.0)
MCHC: 31.7 g/dL (ref 30.0–36.0)
MCV: 97.9 fL (ref 78.0–100.0)
Platelets: 155 10*3/uL (ref 150–400)
RBC: 3.29 MIL/uL — AB (ref 4.22–5.81)
RDW: 19.5 % — ABNORMAL HIGH (ref 11.5–15.5)
WBC: 18.1 10*3/uL — ABNORMAL HIGH (ref 4.0–10.5)

## 2017-04-18 NOTE — Clinical Social Work Note (Signed)
Clinical Social Work Assessment  Patient Details  Name: Jason Calderon MRN: [id number] Date of Birth: 12-22-1921  Date of referral:  04/18/17               Reason for consult:  Facility Placement, Discharge Planning                Permission sought to share information with:  Facility Sport and exercise psychologist, Family Supports Permission granted to share information::  No  Name::     Jason Calderon  Agency::  [facility]  Relationship::  Son  Contact Information:  [phone]  Housing/Transportation Living arrangements for the past 2 months:  [place], [place] of Information:  Adult Children Patient Interpreter Needed:  None Criminal Activity/Legal Involvement Pertinent to Current Situation/Hospitalization:  No - Comment as needed Significant Relationships:  Adult Children Lives with:  Facility Resident, Adult Children Do you feel safe going back to the place where you live?  Yes Need for family participation in patient care:  Yes (Comment)  Care giving concerns:  CSW received consult for possible SNF placement at time of discharge. CSW spoke with patient's son since patient has been disoriented. Patient has been at [facility] since his last hospitalization earlier this year. He would like to return there at discharge. CSW to continue to follow and assist with discharge planning needs.   Social Worker assessment / plan:  CSW spoke with patient's son concerning possibility of returning to rehab at [facility] before returning home.  Employment status:  Retired Forensic scientist:  Medicare PT Recommendations:  Not assessed at this time Information / Referral to community resources:  [place]  Patient/Family's Response to care:  Patient's son recognizes need for rehab before returning home and is agreeable to returning to [facility] and will need PTAR. Patient's son would like to be present during discharge process to help the patient.  Patient/Family's  Understanding of and Emotional Response to Diagnosis, Current Treatment, and Prognosis:  Patient/family is realistic regarding therapy needs and expressed being hopeful for return to SNF placement. Patient's son expressed understanding of CSW role and discharge process as well as medical condition. No questions/concerns about plan or treatment.    Emotional Assessment Appearance:  Appears stated age Attitude/Demeanor/Rapport:  Unable to Assess Affect (typically observed):  Unable to Assess Orientation:  Oriented to Self Alcohol / Substance use:  Not Applicable Psych involvement (Current and /or in the community):  No (Comment)  Discharge Needs  Concerns to be addressed:  Care Coordination Readmission within the last 30 days:  No Current discharge risk:  None Barriers to Discharge:  No Barriers Identified   Benard Halsted, [place] 04/18/2017, 11:18 AM

## 2017-04-18 NOTE — Progress Notes (Signed)
During rounding with on coming RN, pt's foley irrigation OBS not to have output in foley bag. MD called orders received to remove current foley and replace with larger foley and to flush foley and restart irrigation. Verbal orders written, foley removed, night RN and Agricultural consultant updated, BS reporting completed

## 2017-04-18 NOTE — Progress Notes (Signed)
PROGRESS NOTE    Jason Calderon  YWV:[id number] DOB: 1922-01-13 DOA: 04/16/2017 PCP: Ivan Anchors, MD     Brief Narrative:  Jason Calderon is a 83 y.o. male with medical history significant of osteomyelitis of the left foot s/p left 4th digit amputation, dementia, PAD, BPH with urinary retention s/p Foley catheter; who presents with complaints of bloody urine.  Patient was just seen at [facility] for the same thought to be secondary to a traumatic Foley catheter insertion.  At that time he underwent cystoscopy with clot evacuation and fulguration.  There is no note of any significant masses or abnormalities noted at that time.  Bleeding had apparently cleared following discharge, but the patient was noted to again have bloody urine in his Foley bag when seen by his son on day of admission. CT scan found to reveal that the Foley balloon was in the prostatic urethra.  Dr. Diona Fanti of urology was consulted and recommended replacing Foley catheter.    Assessment & Plan:   Principal Problem:   Hematuria Active Problems:   Osteomyelitis of toe of left foot (HCC)   Acute blood loss anemia   Chronic kidney disease, stage III (moderate) (HCC)   Leukocytosis   BPH (benign prostatic hyperplasia)   Hematuria with acute blood loss anemia -Foley placed 11/30 due to urinary retention, developed hematuria and placed on continuous bladder irrigation. Catheter accidentally removed during episode of delirium on 12/12, cystoscopy with Dr. Vernard Gambles on 12/13 which showed large clot in the bladder.  On admission, CT scan of the abdomen revealed signs of a blood clot within the bladder with Foley catheter balloon in the prostatic urethra -Urology consulted.  Continue continuous bladder irrigation until urine clears.  Will need to follow-up with Dr. Vernard Gambles on discharge.  -More clear now, but still intermittent clots. Continue CBI for now.   Pyuria  -Urine culture negative. Stop Rocephin  Chronic kidney disease  stage III -Stable   Chronic osteomyelitis of the left toe -Continue Augmentin with stop date 12/28. Has follow up with Dr. Prudy Feeler (Podiatry) on 05/12/17   Essential hypertension -Continue metoprolol, lisinopril  Hyperlipidemia -Continue Statin  BPH with urinary retention  -Continue Flomax  Dementia -Stable     DVT prophylaxis: SCD Code Status: Full Family Communication: Son over the phone 12/21  Disposition Plan: Pending improvement in hematuria, back to [facility] on discharge   Consultants:   Urology  Procedures:   None   Antimicrobials:  Anti-infectives (From admission, onward)   Start     Dose/Rate Route Frequency Ordered Stop   04/17/17 2200  cefTRIAXone (ROCEPHIN) 1 g in dextrose 5 % 50 mL IVPB  Status:  Discontinued     1 g 100 mL/hr over 30 Minutes Intravenous Every 24 hours 04/17/17 0203 04/18/17 1359   04/17/17 0115  amoxicillin-clavulanate (AUGMENTIN) 875-125 MG per tablet 1 tablet     1 tablet Oral 2 times daily 04/17/17 0114 04/25/17 2159   04/16/17 2030  cefTRIAXone (ROCEPHIN) 1 g in dextrose 5 % 50 mL IVPB     1 g 100 mL/hr over 30 Minutes Intravenous  Once 04/16/17 2017 04/16/17 2116       Subjective: Poor ROS due to dementia, no new complaints   Objective: Vitals:   04/17/17 2154 04/18/17 0650 04/18/17 1006 04/18/17 1339  BP: (!) 118/58 (!) 150/72 124/64 (!) 112/54  Pulse: 81 94 71 89  Resp: 20 (!) 28  18  Temp: 98.5 F (36.9 C) 97.7 F (36.5 C)  (!)  97.5 F (36.4 C)  TempSrc: Oral Oral  Oral  SpO2: 95% 99%  100%  Weight:      Height:        Intake/Output Summary (Last 24 hours) at 04/18/2017 1400 Last data filed at 04/18/2017 1340 Gross per 24 hour  Intake 23704.84 ml  Output 16865 ml  Net 6839.84 ml   Filed Weights   04/16/17 1722  Weight: 80.7 kg (178 lb)    Examination:  General exam: Appears calm and comfortable  Respiratory system: Clear to auscultation. Respiratory effort normal. Cardiovascular system: S1 &  S2 heard, RRR. No JVD, murmurs, rubs, gallops or clicks. No pedal edema. Gastrointestinal system: Abdomen is nondistended, soft and nontender. No organomegaly or masses felt. Normal bowel sounds heard. GU: +foley with clear urine  Central nervous system: Alert. No focal neurological deficits. Skin: No rashes, lesions or ulcers Psychiatry: +Dementia   Data Reviewed: I have personally reviewed following labs and imaging studies  CBC: Recent Labs  Lab 04/16/17 1747 04/17/17 0242 04/17/17 1957 04/18/17 0605  WBC 14.3* 14.7* 16.7* 18.1*  NEUTROABS 12.1*  --   --   --   HGB 7.9* 8.1* 9.7* 10.2*  HCT 24.5* 25.2* 29.8* 32.2*  MCV 102.9* 99.2 95.2 97.9  PLT 152 136* 137* 244   Basic Metabolic Panel: Recent Labs  Lab 04/16/17 1747 04/17/17 0242 04/18/17 0605  NA 136 137 137  K 4.1 3.6 3.9  CL 107 109 106  CO2 22 21* 19*  GLUCOSE 112* 104* 124*  BUN 41* 35* 29*  CREATININE 1.45* 1.25* 1.43*  CALCIUM 9.1 8.8* 9.4   GFR: Estimated Creatinine Clearance: 32.3 mL/min (A) (by C-G formula based on SCr of 1.43 mg/dL (H)). Liver Function Tests: Recent Labs  Lab 04/16/17 1747  AST 23  ALT 21  ALKPHOS 71  BILITOT 0.5  PROT 6.0*  ALBUMIN 3.4*   No results for input(s): LIPASE, AMYLASE in the last 168 hours. No results for input(s): AMMONIA in the last 168 hours. Coagulation Profile: No results for input(s): INR, PROTIME in the last 168 hours. Cardiac Enzymes: No results for input(s): CKTOTAL, CKMB, CKMBINDEX, TROPONINI in the last 168 hours. BNP (last 3 results) No results for input(s): PROBNP in the last 8760 hours. HbA1C: No results for input(s): HGBA1C in the last 72 hours. CBG: No results for input(s): GLUCAP in the last 168 hours. Lipid Profile: No results for input(s): CHOL, HDL, LDLCALC, TRIG, CHOLHDL, LDLDIRECT in the last 72 hours. Thyroid Function Tests: No results for input(s): TSH, T4TOTAL, FREET4, T3FREE, THYROIDAB in the last 72 hours. Anemia Panel: No  results for input(s): VITAMINB12, FOLATE, FERRITIN, TIBC, IRON, RETICCTPCT in the last 72 hours. Sepsis Labs: No results for input(s): PROCALCITON, LATICACIDVEN in the last 168 hours.  Recent Results (from the past 240 hour(s))  Urine culture     Status: None   Collection Time: 04/16/17  5:47 PM  Result Value Ref Range Status   Specimen Description URINE, RANDOM  Final   Special Requests NONE  Final   Culture NO GROWTH  Final   Report Status 04/17/2017 FINAL  Final       Radiology Studies: Dg Chest Port 1 View  Result Date: 04/17/2017 CLINICAL DATA:  Acute anemia. EXAM: PORTABLE CHEST 1 VIEW COMPARISON:  Lung bases from abdominal CT yesterday. FINDINGS: Enlargement of the cardiac silhouette accentuated by low lung volumes a retrocardiac hiatal hernia. No consolidation. No pulmonary edema, pleural effusion or pneumothorax. Calcifications projecting over the right upper  arm and lateral hemithorax appear chronic. IMPRESSION: Large cardiac silhouette in part accentuated by large hiatal hernia and low lung volumes. No acute abnormality. Electronically Signed   By: Jeb Levering M.D.   On: 04/17/2017 01:50   Ct Renal Stone Study  Result Date: 04/16/2017 CLINICAL DATA:  Hematuria for 4 days. Listed history of cystoscopy with clot evacuation the last month. EXAM: CT ABDOMEN AND PELVIS WITHOUT CONTRAST TECHNIQUE: Multidetector CT imaging of the abdomen and pelvis was performed following the standard protocol without IV contrast. COMPARISON:  None. FINDINGS: Lower chest: Large hiatal hernia, majority of the stomach is intrathoracic. No evidence of gastric inflammation. Normal heart size with coronary artery calcifications. Probable intrapulmonary lymph node in the middle lobe. Punctate granuloma in the left lower lobe. Hepatobiliary: Subcentimeter hypodensities in the liver are too small to characterize, scattered calcified granuloma. Calcified gallstones within physiologically distended  gallbladder. No pericholecystic inflammation or biliary dilatation. Pancreas: No ductal dilatation or inflammation. Spleen: Spleen is normal in size. No focal abnormality on noncontrast exam. Adrenals/Urinary Tract: No adrenal nodule. Thinning of bilateral renal parenchyma. Lobular soft tissue fullness of the anterior left kidney, incompletely characterized without contrast, may reflect prominent renal tissue versus solid mass. No hydronephrosis or urolithiasis. Foley catheter in the urinary bladder, balloon likely in the prostatic urethra. Bladder filled with heterogeneous lobular material. This is questionably contiguous with the left anterior bladder wall, otherwise no bladder wall attachments are seen. Mild diffuse bladder wall thickening. Probable small right anterior bladder diverticulum. Mild perivesicular stranding. Stomach/Bowel: Large hiatal hernia, majority of the stomach is intrathoracic. Umbilical hernia contains nonobstructed noninflamed small bowel. No bowel inflammation or wall thickening. Multifocal colonic diverticulosis without diverticulitis. Moderate colonic stool burden and tortuosity. Stool distends the rectum. Normal appendix. Vascular/Lymphatic: Aortic and branch atherosclerosis, advanced. No aneurysm. No definite enlarged abdominal or pelvic lymph nodes. Reproductive: Foley catheter balloon in the prostatic urethra. Other: No free air or ascites. Umbilical hernia containing nonobstructed small bowel. Musculoskeletal: There are no acute or suspicious osseous abnormalities. Multilevel degenerative change in the lumbar spine. Prominent Schmorl's node superior endplate of L4. IMPRESSION: 1. Bladder filled with large amount of heterogeneous lobular material, likely hemorrhage/blood clot. Focal mass fell less likely, however there is questionable continuity with left bladder wall. Recommend correlation with recent cystoscopy. 2. Foley catheter balloon in the prostatic urethra. 3. Lobular soft  tissue prominence of the anterior left kidney, likely lobular renal tissue, focal mass difficult to exclude without contrast. 4. Chronic findings not related to hematuria included large hiatal hernia, cholelithiasis, colonic diverticulosis, and advanced aortic atherosclerosis. 5. Umbilical hernia containing nonobstructed noninflamed small bowel. Aortic Atherosclerosis (ICD10-I70.0). Electronically Signed   By: Jeb Levering M.D.   On: 04/16/2017 23:07      Scheduled Meds: . amoxicillin-clavulanate  1 tablet Oral BID  . clotrimazole   Topical BID  . feeding supplement  1 Container Oral TID BM  . ferrous sulfate  325 mg Oral BID WC  . folic acid  1 mg Oral Daily  . lisinopril  5 mg Oral Daily  . metoprolol succinate  50 mg Oral Daily  . pravastatin  20 mg Oral q1800  . senna-docusate  2 tablet Oral QHS  . tamsulosin  0.4 mg Oral Daily  . thiamine  100 mg Oral Daily   Continuous Infusions:    LOS: 1 day    Time spent: 30 minutes   Dessa Phi, DO [facility] [email or web address] Password [email or web address] 04/18/2017, 2:00 PM

## 2017-04-18 NOTE — Progress Notes (Signed)
Pt attempting to get OOB, pt is currently only oriented to self which is change from admission baseline.

## 2017-04-18 NOTE — NC FL2 (Signed)
[place] MEDICAID FL2 LEVEL OF CARE SCREENING TOOL     IDENTIFICATION  Patient Name: Jason Calderon Birthdate: 1921/07/27 Sex: male Admission Date (Current Location): 04/16/2017  [facility] and [place] Number:  Herbalist and Address:  [facility], [place] [address], [place], [place] [address]      Provider Number: [id number]  Attending Physician Name and Address:  Dessa Phi, DO  Relative Name and Phone Number:  Reita Cliche, son ,  [phone]     Current Level of Care: Hospital Recommended Level of Care: [place] Prior Approval Number:    Date Approved/Denied:   PASRR Number: [id number] (found in [place] with the last name Money)  Discharge Plan: SNF    Current Diagnoses: Patient Active Problem List   Diagnosis Date Noted  . Leukocytosis 04/17/2017  . BPH (benign prostatic hyperplasia) 04/17/2017  . Neoplasm of uncertain behavior 02/77/4128  . Peripheral vascular disease ([place]) 04/15/2017  . Acute metabolic encephalopathy 78/67/6720  . Hematuria 04/15/2017  . Acute blood loss anemia 04/15/2017  . Coronary artery disease 04/15/2017  . Chronic kidney disease, stage III (moderate) ([place]) 04/15/2017  . Superficial thrombophlebitis 04/15/2017  . Mixed dyslipidemia 04/15/2017  . Osteomyelitis of toe of left foot ([place]) 04/14/2017    Orientation RESPIRATION BLADDER Height & Weight     Self  Normal Continent, Indwelling catheter Weight: 80.7 kg (178 lb) Height:  5\' 9"  (175.3 cm)  BEHAVIORAL SYMPTOMS/MOOD NEUROLOGICAL BOWEL NUTRITION STATUS      Continent Diet(Please see DC Summary)  AMBULATORY STATUS COMMUNICATION OF NEEDS Skin   Limited Assist Verbally Other (Comment)(Wound on leg)                       Personal Care Assistance Level of Assistance  Bathing, Feeding, Dressing Bathing Assistance: Limited assistance Feeding assistance: Limited assistance Dressing Assistance: Limited assistance     Functional  Limitations Info             SPECIAL CARE FACTORS FREQUENCY  PT (By licensed PT)     PT Frequency: not assessed-5x/week              Contractures      Additional Factors Info  Code Status, Allergies Code Status Info: Full Allergies Info: NKA           Current Medications (04/18/2017):  This is the current hospital active medication list Current Facility-Administered Medications  Medication Dose Route Frequency Provider Last Rate Last Dose  . acetaminophen (TYLENOL) tablet 650 mg  650 mg Oral Q6H PRN Fuller Plan A, MD       Or  . acetaminophen (TYLENOL) suppository 650 mg  650 mg Rectal Q6H PRN Fuller Plan A, MD      . amoxicillin-clavulanate (AUGMENTIN) 875-125 MG per tablet 1 tablet  1 tablet Oral BID Fuller Plan A, MD   1 tablet at 04/18/17 1007  . cefTRIAXone (ROCEPHIN) 1 g in dextrose 5 % 50 mL IVPB  1 g Intravenous Q24H Erenest Blank, RPH 100 mL/hr at 04/17/17 2226 1 g at 04/17/17 2226  . clotrimazole (LOTRIMIN) 1 % cream   Topical BID Smith, Rondell A, MD      . feeding supplement (BOOST / RESOURCE BREEZE) liquid 1 Container  1 Container Oral TID BM Dessa Phi, DO   1 Container at 04/18/17 1010  . ferrous sulfate tablet 325 mg  325 mg Oral BID WC Smith, Rondell A, MD   325 mg  at 04/18/17 1008  . folic acid (FOLVITE) tablet 1 mg  1 mg Oral Daily Smith, Rondell A, MD   1 mg at 04/18/17 1008  . HYDROcodone-acetaminophen (NORCO/VICODIN) 5-325 MG per tablet 1 tablet  1 tablet Oral Q6H PRN Norval Morton, MD   1 tablet at 04/17/17 2226  . ipratropium-albuterol (DUONEB) 0.5-2.5 (3) MG/3ML nebulizer solution 3 mL  3 mL Nebulization Q6H PRN Smith, Rondell A, MD      . lisinopril (PRINIVIL,ZESTRIL) tablet 5 mg  5 mg Oral Daily Smith, Rondell A, MD   5 mg at 04/18/17 1009  . metoprolol succinate (TOPROL-XL) 24 hr tablet 50 mg  50 mg Oral Daily Smith, Rondell A, MD   50 mg at 04/18/17 1009  . ondansetron (ZOFRAN) tablet 4 mg  4 mg Oral Q6H PRN Fuller Plan A,  MD       Or  . ondansetron (ZOFRAN) injection 4 mg  4 mg Intravenous Q6H PRN Smith, Rondell A, MD      . pravastatin (PRAVACHOL) tablet 20 mg  20 mg Oral q1800 Fuller Plan A, MD   20 mg at 04/17/17 1831  . senna-docusate (Senokot-S) tablet 2 tablet  2 tablet Oral QHS Fuller Plan A, MD   2 tablet at 04/17/17 2225  . tamsulosin (FLOMAX) capsule 0.4 mg  0.4 mg Oral Daily Smith, Rondell A, MD   0.4 mg at 04/18/17 1009  . thiamine (VITAMIN B-1) tablet 100 mg  100 mg Oral Daily Fuller Plan A, MD   100 mg at 04/18/17 1009     Discharge Medications: Please see discharge summary for a list of discharge medications.  Relevant Imaging Results:  Relevant Lab Results:   Additional Information SSN: [id number] [place], [place]

## 2017-04-19 LAB — CBC
HEMATOCRIT: 26.8 % — AB (ref 39.0–52.0)
Hemoglobin: 8.5 g/dL — ABNORMAL LOW (ref 13.0–17.0)
MCH: 31.3 pg (ref 26.0–34.0)
MCHC: 31.7 g/dL (ref 30.0–36.0)
MCV: 98.5 fL (ref 78.0–100.0)
PLATELETS: 111 10*3/uL — AB (ref 150–400)
RBC: 2.72 MIL/uL — ABNORMAL LOW (ref 4.22–5.81)
RDW: 18.7 % — AB (ref 11.5–15.5)
WBC: 9.9 10*3/uL (ref 4.0–10.5)

## 2017-04-19 LAB — BASIC METABOLIC PANEL
ANION GAP: 5 (ref 5–15)
BUN: 27 mg/dL — ABNORMAL HIGH (ref 6–20)
CALCIUM: 8.5 mg/dL — AB (ref 8.9–10.3)
CO2: 23 mmol/L (ref 22–32)
Chloride: 111 mmol/L (ref 101–111)
Creatinine, Ser: 1.36 mg/dL — ABNORMAL HIGH (ref 0.61–1.24)
GFR, EST AFRICAN AMERICAN: 50 mL/min — AB (ref >60)
GFR, EST NON AFRICAN AMERICAN: 43 mL/min — AB (ref >60)
Glucose, Bld: 91 mg/dL (ref 65–99)
Potassium: 3.6 mmol/L (ref 3.5–5.1)
Sodium: 139 mmol/L (ref 135–145)

## 2017-04-19 LAB — TYPE AND SCREEN
ABO/RH(D): O NEG
ANTIBODY SCREEN: NEGATIVE
UNIT DIVISION: 0
Unit division: 0
Unit division: 0

## 2017-04-19 LAB — GLUCOSE, CAPILLARY
GLUCOSE-CAPILLARY: 107 mg/dL — AB (ref 65–99)
Glucose-Capillary: 84 mg/dL (ref 65–99)

## 2017-04-19 LAB — BPAM RBC
BLOOD PRODUCT EXPIRATION DATE: 201812252359
BLOOD PRODUCT EXPIRATION DATE: 201812252359
Blood Product Expiration Date: 201812252359
ISSUE DATE / TIME: 201812192216
ISSUE DATE / TIME: 201812201151
UNIT TYPE AND RH: 9500
Unit Type and Rh: 9500
Unit Type and Rh: 9500

## 2017-04-19 NOTE — Progress Notes (Signed)
Patient will Discharge To: [facility] Anticipated DC Date:04-19-17 Family Notified:yes, RN Transport [id number]   Per MD patient ready for DC to [facility] . RN, patient, patient's family, and facility notified of DC. Assessment, Fl2/Pasrr, and Discharge Summary sent to facility. RN given number for report [phone]). DC packet on chart. Ambulance transport requested for patient.   CSW signing off.  Reed Breech LCSWA [phone]

## 2017-04-19 NOTE — Discharge Summary (Signed)
Physician Discharge Summary  Morgan Rennert POE:[id number] DOB: [birth year] DOA: 04/16/2017  PCP: Ivan Anchors, MD  Admit date: 04/16/2017 Discharge date: 04/19/2017  Admitted From: SNF Disposition:  SNF   Recommendations for Outpatient Follow-up:  1. Follow up with PCP in 1 week 2. Follow up with Dr. Vernard Gambles, Urology, in 1 week 3. Follow up with Dr. Prudy Feeler, Podiatry, on 05/12/17  4. Keep foley catheter balloon filled with at least 30cc water to prevent dislodging it if pulled accidentally  5. Please obtain BMP/CBC in 1 week   Discharge Condition: Stable CODE STATUS: Full  Diet recommendation: Heart healthy   Brief/Interim Summary: Jason Mooneyis a 83 y.o.malewith medical history significant ofosteomyelitis of the left foots/pleft 4th digit amputation,dementia,PAD, BPH with urinary retention s/pFoley catheter;who presents with complaints of bloody urine. Patient was just seen at [facility] for the same thought to be secondary to a traumatic Foley catheter insertion. At that time he underwent cystoscopy with clot evacuation and fulguration. There is no note of any significant masses or abnormalities noted at that time. Bleeding had apparently cleared following discharge, but the patient was noted to again have bloody urine in his Foley bag when seen by his son on day of admission.CT scan found to reveal that theFoley balloon was in the prostatic urethra. Dr. Diona Fanti of urology was consulted and recommended replacing Foley catheter. Patient underwent continuous bladder irrigation with improvement in blood clot and hematuria.   Discharge Diagnoses:  Principal Problem:   Hematuria Active Problems:   Osteomyelitis of toe of left foot (HCC)   Acute blood loss anemia   Chronic kidney disease, stage III (moderate) (HCC)   Leukocytosis   BPH (benign prostatic hyperplasia)   Hematuria with acute blood loss anemia -Foley placed 11/30 due to urinary retention, developed  hematuria and placed on continuous bladder irrigation. Catheter accidentally removed during episode of delirium on 12/12, cystoscopy with Dr. Vernard Gambles on 12/13 which showed large clot in the bladder.  On admission, CT scan of the abdomen revealed signs of a blood clot within the bladder with Foley catheter balloon in the prostatic urethra -Urology consulted.  Continue continuous bladder irrigation until urine clears. Stop CBI now that urine has cleared. Will need to follow-up with Dr. Vernard Gambles on discharge.  -Hold aspirin   Pyuria  -Urine culture negative. Stop Rocephin  Chronic kidney disease stage III -Stable   Chronic osteomyelitis of the left toe -S/p 4th ray amputation. Continue Augmentinwithstop date 12/28. Has follow up with Dr. Prudy Feeler (Podiatry) on 05/12/17   Essential hypertension -Continue metoprolol, lisinopril  Hyperlipidemia -Continue Statin  BPH with urinary retention  -Continue Flomax  Dementia -Stable    Discharge Instructions  Discharge Instructions    Diet - low sodium heart healthy   Complete by:  As directed    Discharge instructions   Complete by:  As directed    Keep foley catheter balloon filled with at least 30cc water to prevent dislodging it if pulled accidentally   Increase activity slowly   Complete by:  As directed      Allergies as of 04/19/2017   No Known Allergies     Medication List    STOP taking these medications   aspirin 81 MG chewable tablet   HYDROcodone-acetaminophen 5-325 MG tablet Commonly known as:  NORCO/VICODIN     TAKE these medications   amoxicillin-clavulanate 875-125 MG tablet Commonly known as:  AUGMENTIN Take 1 tablet by mouth 2 (two) times daily.   clotrimazole-betamethasone cream Commonly known as:  LOTRISONE Apply 1 application topically 2 (two) times daily. The cream also has Dipropionate Apply to perineum and groin   feeding supplement (PRO-STAT SUGAR FREE 64) Liqd Take 30 mLs by mouth daily.    ferrous sulfate 325 (65 FE) MG tablet Take 325 mg by mouth 2 (two) times daily with a meal.   folic acid 1 MG tablet Commonly known as:  FOLVITE Take 1 mg by mouth daily.   lisinopril 5 MG tablet Commonly known as:  PRINIVIL,ZESTRIL Take 5 mg by mouth daily.   metoprolol succinate 50 MG 24 hr tablet Commonly known as:  TOPROL-XL Take 50 mg by mouth daily. Take with or immediately following a meal.   nitroGLYCERIN 0.4 MG SL tablet Commonly known as:  NITROSTAT Place 0.4 mg under the tongue every 5 (five) minutes as needed for chest pain.   NUTRITIONAL SUPPLEMENT Liqd Take 120 mLs by mouth. MedPass   pravastatin 20 MG tablet Commonly known as:  PRAVACHOL Take 20 mg by mouth daily.   senna-docusate 8.6-50 MG tablet Commonly known as:  Senokot-S Take 2 tablets by mouth at bedtime.   tamsulosin 0.4 MG Caps capsule Commonly known as:  FLOMAX Take 0.4 mg by mouth daily.   thiamine 100 MG tablet Take 100 mg by mouth daily.   TYLENOL 325 MG tablet Generic drug:  acetaminophen Take 650 mg by mouth every 6 (six) hours as needed for mild pain or moderate pain.      Follow-up Information    Neil Crouch, MD. Schedule an appointment as soon as possible for a visit in 1 week(s).   Specialty:  Urology Contact information: [address] [place] [address] [phone]        Cleotilde Neer, DPM Follow up on 05/12/2017.   Specialty:  Podiatry Contact information: [address] [place] [address] [phone]        Ivan Anchors, MD. Schedule an appointment as soon as possible for a visit in 1 week(s).   Specialty:  Family Medicine Contact information: [place] [address] [phone]          No Known Allergies  Consultations:  Urology   Procedures/Studies: Dg Chest Port 1 View  Result Date: 04/17/2017 CLINICAL DATA:  Acute anemia. EXAM: PORTABLE CHEST 1 VIEW COMPARISON:  Lung bases from  abdominal CT yesterday. FINDINGS: Enlargement of the cardiac silhouette accentuated by low lung volumes a retrocardiac hiatal hernia. No consolidation. No pulmonary edema, pleural effusion or pneumothorax. Calcifications projecting over the right upper arm and lateral hemithorax appear chronic. IMPRESSION: Large cardiac silhouette in part accentuated by large hiatal hernia and low lung volumes. No acute abnormality. Electronically Signed   By: Jeb Levering M.D.   On: 04/17/2017 01:50   Ct Renal Stone Study  Result Date: 04/16/2017 CLINICAL DATA:  Hematuria for 4 days. Listed history of cystoscopy with clot evacuation the last month. EXAM: CT ABDOMEN AND PELVIS WITHOUT CONTRAST TECHNIQUE: Multidetector CT imaging of the abdomen and pelvis was performed following the standard protocol without IV contrast. COMPARISON:  None. FINDINGS: Lower chest: Large hiatal hernia, majority of the stomach is intrathoracic. No evidence of gastric inflammation. Normal heart size with coronary artery calcifications. Probable intrapulmonary lymph node in the middle lobe. Punctate granuloma in the left lower lobe. Hepatobiliary: Subcentimeter hypodensities in the liver are too small to characterize, scattered calcified granuloma. Calcified gallstones within physiologically distended gallbladder. No pericholecystic inflammation or biliary dilatation. Pancreas: No ductal dilatation or  inflammation. Spleen: Spleen is normal in size. No focal abnormality on noncontrast exam. Adrenals/Urinary Tract: No adrenal nodule. Thinning of bilateral renal parenchyma. Lobular soft tissue fullness of the anterior left kidney, incompletely characterized without contrast, may reflect prominent renal tissue versus solid mass. No hydronephrosis or urolithiasis. Foley catheter in the urinary bladder, balloon likely in the prostatic urethra. Bladder filled with heterogeneous lobular material. This is questionably contiguous with the left anterior  bladder wall, otherwise no bladder wall attachments are seen. Mild diffuse bladder wall thickening. Probable small right anterior bladder diverticulum. Mild perivesicular stranding. Stomach/Bowel: Large hiatal hernia, majority of the stomach is intrathoracic. Umbilical hernia contains nonobstructed noninflamed small bowel. No bowel inflammation or wall thickening. Multifocal colonic diverticulosis without diverticulitis. Moderate colonic stool burden and tortuosity. Stool distends the rectum. Normal appendix. Vascular/Lymphatic: Aortic and branch atherosclerosis, advanced. No aneurysm. No definite enlarged abdominal or pelvic lymph nodes. Reproductive: Foley catheter balloon in the prostatic urethra. Other: No free air or ascites. Umbilical hernia containing nonobstructed small bowel. Musculoskeletal: There are no acute or suspicious osseous abnormalities. Multilevel degenerative change in the lumbar spine. Prominent Schmorl's node superior endplate of L4. IMPRESSION: 1. Bladder filled with large amount of heterogeneous lobular material, likely hemorrhage/blood clot. Focal mass fell less likely, however there is questionable continuity with left bladder wall. Recommend correlation with recent cystoscopy. 2. Foley catheter balloon in the prostatic urethra. 3. Lobular soft tissue prominence of the anterior left kidney, likely lobular renal tissue, focal mass difficult to exclude without contrast. 4. Chronic findings not related to hematuria included large hiatal hernia, cholelithiasis, colonic diverticulosis, and advanced aortic atherosclerosis. 5. Umbilical hernia containing nonobstructed noninflamed small bowel. Aortic Atherosclerosis (ICD10-I70.0). Electronically Signed   By: Jeb Levering M.D.   On: 04/16/2017 23:07       Discharge Exam: Vitals:   04/18/17 2057 04/19/17 0458  BP: (!) 126/57 (!) 127/58  Pulse: (!) 57 (!) 50  Resp: 18 18  Temp: 98.1 F (36.7 C) 97.8 F (36.6 C)  SpO2: 100% 99%    Vitals:   04/18/17 1006 04/18/17 1339 04/18/17 2057 04/19/17 0458  BP: 124/64 (!) 112/54 (!) 126/57 (!) 127/58  Pulse: 71 89 (!) 57 (!) 50  Resp:  18 18 18   Temp:  (!) 97.5 F (36.4 C) 98.1 F (36.7 C) 97.8 F (36.6 C)  TempSrc:  Oral    SpO2:  100% 100% 99%  Weight:      Height:        General: Pt is alert, awake, not in acute distress Cardiovascular: RRR, S1/S2 +, no rubs, no gallops Respiratory: CTA bilaterally, no wheezing, no rhonchi Abdominal: Soft, NT, ND, bowel sounds + Extremities: no edema, no cyanosis GU: foley without blood or clots, clear yellow     The results of significant diagnostics from this hospitalization (including imaging, microbiology, ancillary and laboratory) are listed below for reference.     Microbiology: Recent Results (from the past 240 hour(s))  Urine culture     Status: None   Collection Time: 04/16/17  5:47 PM  Result Value Ref Range Status   Specimen Description URINE, RANDOM  Final   Special Requests NONE  Final   Culture NO GROWTH  Final   Report Status 04/17/2017 FINAL  Final  Culture, blood (routine x 2)     Status: None (Preliminary result)   Collection Time: 04/17/17  2:39 AM  Result Value Ref Range Status   Specimen Description BLOOD RIGHT ANTECUBITAL  Final   Special Requests  Final    BOTTLES DRAWN AEROBIC AND ANAEROBIC Blood Culture results may not be optimal due to an excessive volume of blood received in culture bottles   Culture NO GROWTH 1 DAY  Final   Report Status PENDING  Incomplete  Culture, blood (routine x 2)     Status: None (Preliminary result)   Collection Time: 04/17/17  2:42 AM  Result Value Ref Range Status   Specimen Description BLOOD RIGHT HAND  Final   Special Requests   Final    BOTTLES DRAWN AEROBIC AND ANAEROBIC Blood Culture adequate volume   Culture NO GROWTH 1 DAY  Final   Report Status PENDING  Incomplete     Labs: BNP (last 3 results) No results for input(s): BNP in the last 8760  hours. Basic Metabolic Panel: Recent Labs  Lab 04/16/17 1747 04/17/17 0242 04/18/17 0605  NA 136 137 137  K 4.1 3.6 3.9  CL 107 109 106  CO2 22 21* 19*  GLUCOSE 112* 104* 124*  BUN 41* 35* 29*  CREATININE 1.45* 1.25* 1.43*  CALCIUM 9.1 8.8* 9.4   Liver Function Tests: Recent Labs  Lab 04/16/17 1747  AST 23  ALT 21  ALKPHOS 71  BILITOT 0.5  PROT 6.0*  ALBUMIN 3.4*   No results for input(s): LIPASE, AMYLASE in the last 168 hours. No results for input(s): AMMONIA in the last 168 hours. CBC: Recent Labs  Lab 04/16/17 1747 04/17/17 0242 04/17/17 1957 04/18/17 0605 04/19/17 0741  WBC 14.3* 14.7* 16.7* 18.1* 9.9  NEUTROABS 12.1*  --   --   --   --   HGB 7.9* 8.1* 9.7* 10.2* 8.5*  HCT 24.5* 25.2* 29.8* 32.2* 26.8*  MCV 102.9* 99.2 95.2 97.9 98.5  PLT 152 136* 137* 155 111*   Cardiac Enzymes: No results for input(s): CKTOTAL, CKMB, CKMBINDEX, TROPONINI in the last 168 hours. BNP: Invalid input(s): POCBNP CBG: Recent Labs  Lab 04/19/17 0803  GLUCAP 84   D-Dimer No results for input(s): DDIMER in the last 72 hours. Hgb A1c No results for input(s): HGBA1C in the last 72 hours. Lipid Profile No results for input(s): CHOL, HDL, LDLCALC, TRIG, CHOLHDL, LDLDIRECT in the last 72 hours. Thyroid function studies No results for input(s): TSH, T4TOTAL, T3FREE, THYROIDAB in the last 72 hours.  Invalid input(s): FREET3 Anemia work up No results for input(s): VITAMINB12, FOLATE, FERRITIN, TIBC, IRON, RETICCTPCT in the last 72 hours. Urinalysis    Component Value Date/Time   COLORURINE RED (A) 04/16/2017 1747   APPEARANCEUR TURBID (A) 04/16/2017 1747   LABSPEC  04/16/2017 1747    TEST NOT REPORTED DUE TO COLOR INTERFERENCE OF URINE PIGMENT   PHURINE  04/16/2017 1747    TEST NOT REPORTED DUE TO COLOR INTERFERENCE OF URINE PIGMENT   GLUCOSEU (A) 04/16/2017 1747    TEST NOT REPORTED DUE TO COLOR INTERFERENCE OF URINE PIGMENT   HGBUR (A) 04/16/2017 1747    TEST NOT  REPORTED DUE TO COLOR INTERFERENCE OF URINE PIGMENT   BILIRUBINUR (A) 04/16/2017 1747    TEST NOT REPORTED DUE TO COLOR INTERFERENCE OF URINE PIGMENT   KETONESUR (A) 04/16/2017 1747    TEST NOT REPORTED DUE TO COLOR INTERFERENCE OF URINE PIGMENT   PROTEINUR (A) 04/16/2017 1747    TEST NOT REPORTED DUE TO COLOR INTERFERENCE OF URINE PIGMENT   NITRITE (A) 04/16/2017 1747    TEST NOT REPORTED DUE TO COLOR INTERFERENCE OF URINE PIGMENT   LEUKOCYTESUR (A) 04/16/2017 1747    TEST  NOT REPORTED DUE TO COLOR INTERFERENCE OF URINE PIGMENT   Sepsis Labs Invalid input(s): PROCALCITONIN,  WBC,  LACTICIDVEN Microbiology Recent Results (from the past 240 hour(s))  Urine culture     Status: None   Collection Time: 04/16/17  5:47 PM  Result Value Ref Range Status   Specimen Description URINE, RANDOM  Final   Special Requests NONE  Final   Culture NO GROWTH  Final   Report Status 04/17/2017 FINAL  Final  Culture, blood (routine x 2)     Status: None (Preliminary result)   Collection Time: 04/17/17  2:39 AM  Result Value Ref Range Status   Specimen Description BLOOD RIGHT ANTECUBITAL  Final   Special Requests   Final    BOTTLES DRAWN AEROBIC AND ANAEROBIC Blood Culture results may not be optimal due to an excessive volume of blood received in culture bottles   Culture NO GROWTH 1 DAY  Final   Report Status PENDING  Incomplete  Culture, blood (routine x 2)     Status: None (Preliminary result)   Collection Time: 04/17/17  2:42 AM  Result Value Ref Range Status   Specimen Description BLOOD RIGHT HAND  Final   Special Requests   Final    BOTTLES DRAWN AEROBIC AND ANAEROBIC Blood Culture adequate volume   Culture NO GROWTH 1 DAY  Final   Report Status PENDING  Incomplete     Time coordinating discharge: 40 minutes  SIGNED:  Dessa Phi, DO [facility] Pager [phone]  If 7PM-7AM, please contact night-coverage [email or web address] Password [facility] 04/19/2017, 9:51 AM

## 2017-04-19 NOTE — Plan of Care (Signed)
Discharge instructions reviewed with patient.  Package completed with LCSW.  Will call for [facility] pick-up when ready.

## 2017-04-19 NOTE — Progress Notes (Signed)
Report called to [facility].  AVS printed out for patient transport.

## 2017-04-22 LAB — CULTURE, BLOOD (ROUTINE X 2)
CULTURE: NO GROWTH
Culture: NO GROWTH
Special Requests: ADEQUATE

## 2017-04-23 ENCOUNTER — Non-Acute Institutional Stay (SKILLED_NURSING_FACILITY): Payer: Medicare Other | Admitting: Adult Health

## 2017-04-23 ENCOUNTER — Encounter: Payer: Self-pay | Admitting: Adult Health

## 2017-04-23 DIAGNOSIS — Z7289 Other problems related to lifestyle: Secondary | ICD-10-CM

## 2017-04-23 DIAGNOSIS — N183 Chronic kidney disease, stage 3 unspecified: Secondary | ICD-10-CM

## 2017-04-23 DIAGNOSIS — I10 Essential (primary) hypertension: Secondary | ICD-10-CM

## 2017-04-23 DIAGNOSIS — F0390 Unspecified dementia without behavioral disturbance: Secondary | ICD-10-CM

## 2017-04-23 DIAGNOSIS — D62 Acute posthemorrhagic anemia: Secondary | ICD-10-CM | POA: Diagnosis not present

## 2017-04-23 DIAGNOSIS — E782 Mixed hyperlipidemia: Secondary | ICD-10-CM | POA: Diagnosis not present

## 2017-04-23 DIAGNOSIS — R310 Gross hematuria: Secondary | ICD-10-CM

## 2017-04-23 DIAGNOSIS — N400 Enlarged prostate without lower urinary tract symptoms: Secondary | ICD-10-CM

## 2017-04-23 DIAGNOSIS — F1090 Alcohol use, unspecified, uncomplicated: Secondary | ICD-10-CM

## 2017-04-23 DIAGNOSIS — M869 Osteomyelitis, unspecified: Secondary | ICD-10-CM

## 2017-04-23 DIAGNOSIS — I2510 Atherosclerotic heart disease of native coronary artery without angina pectoris: Secondary | ICD-10-CM | POA: Diagnosis not present

## 2017-04-23 LAB — BASIC METABOLIC PANEL
BUN: 42 — AB (ref 4–21)
CREATININE: 1.2 (ref 0.6–1.3)
Glucose: 106
POTASSIUM: 4.7 (ref 3.4–5.3)
SODIUM: 143 (ref 137–147)

## 2017-04-23 LAB — CBC AND DIFFERENTIAL
HEMATOCRIT: 31 — AB (ref 41–53)
HEMOGLOBIN: 10.1 — AB (ref 13.5–17.5)
Neutrophils Absolute: 16
Platelets: 120 — AB (ref 150–399)
WBC: 19.1

## 2017-04-23 NOTE — Progress Notes (Signed)
Location:  [facility] Room Number: 854-O Place of Service:  SNF (31) Provider:  Durenda Age, NP  Patient Care Team: Ivan Anchors, MD as PCP - General (Family Medicine)  Extended Emergency Contact Information Primary Emergency Contact: Racicot,Darryl Mobile Phone: [phone] Relation: Son  Code Status:  Full Code  Goals of care: Advanced Directive information Advanced Directives 04/18/2017  Does Patient Have a Medical Advance Directive? Yes  Type of Advance Directive -  Does patient want to make changes to medical advance directive? -     Chief Complaint  Patient presents with  . Acute Visit    Followup on [facility] hospitalization 12/19-12/22/18 for traumatic hematuria    HPI:  Jason Calderon is a 83 y.o. male seen today for an acute visit for followup of hospitalization at [facility] 12/19-12/22/18 for traumatic hematuria and placement of a new Foley catheter. CT scan revealed that the foley balloon was in the prostatic urethra. Urology was consulted and recommended foley catheter to be replaced. He underwent continuous bladder irrigation with improvement in blood clot and hematuria. He was having a short-term rehabilitation at [facility] prior to being sent to the hospital. 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 He had complication of urinary retention with gross hematuria. It was thought to be due to traumatic Foley catheter placement. Urology was consulted for which he had continuous bladder irrigation. He accidentally pulled Out [address], Foley catheter and was reinserted on 04/04/17. He continued to have hematuria so urology perform cystoscopy on 04/10/17 and evacuated a large clot in the bladder. Continuous bladder irrigation was continued 24 hours then urine became clear and CBI was discontinued. He was readmitted to [facility] on 04/19/17 for  short-term rehabilitation.  He has a PMH of benign non-nodular prostatic hyperplasia, stage 3 CKD, angina pectoris, essential HTN, frequent falls, mixed HLD, PUD, and non-traumatic rhabdomyolysis. He was seen in his room today.      Past Medical History:  Diagnosis Date  . CKD (chronic kidney disease) stage 3, GFR 30-59 ml/min (HCC) 11/17/2015  . Essential hypertension 11/17/2015  . Left foot infection 03/28/2017  . Mixed hyperlipidemia 11/17/2015  . PAD (peripheral artery disease) ([place]) 03/29/2017   Past Surgical History:  Procedure Laterality Date  . CATARACT EXTRACTION    . CORONARY ANGIOPLASTY  1994  . CYSTOSCOPY WITH CLOT EVACUATION/FULGURATION  02/2017  . FOOT DEBRIDEMENT WITH 4TH RAY AMPUTATION Left 03/2017  . KNEE ARTHROSCOPY Right 12/2006  . KNEE ARTHROSCOPY Left 11/2007 and 12/2008    No Known Allergies  Outpatient Encounter Medications as of 04/23/2017  Medication Sig  . acetaminophen (TYLENOL) 325 MG tablet Take 650 mg by mouth every 6 (six) hours as needed for mild pain or moderate pain.  . Amino Acids-Protein Hydrolys (FEEDING SUPPLEMENT, PRO-STAT SUGAR FREE 64,) LIQD Take 30 mLs by mouth daily.  Marland Kitchen amoxicillin-clavulanate (AUGMENTIN) 875-125 MG tablet Take 1 tablet by mouth 2 (two) times daily.   . clotrimazole-betamethasone (LOTRISONE) cream Apply 1 application topically 2 (two) times daily. The cream also has Dipropionate Apply to perineum and groin  . ferrous sulfate 325 (65 FE) MG tablet Take 325 mg by mouth 2 (two) times daily with a meal.  . folic acid (FOLVITE) 1 MG tablet Take 1 mg by mouth daily.  Marland Kitchen lisinopril (PRINIVIL,ZESTRIL) 5 MG tablet Take 5 mg by mouth daily.  . metoprolol succinate (TOPROL-XL) 50 MG 24 hr tablet Take  50 mg by mouth daily. Take with or immediately following a meal.  . nitroGLYCERIN (NITROSTAT) 0.4 MG SL tablet Place 0.4 mg under the tongue every 5 (five) minutes as needed for chest pain.  Marland Kitchen NUTRITIONAL SUPPLEMENT LIQD Take 120 mLs by  mouth. MedPass  . pravastatin (PRAVACHOL) 20 MG tablet Take 20 mg by mouth daily.  Marland Kitchen senna-docusate (SENOKOT-S) 8.6-50 MG tablet Take 2 tablets by mouth at bedtime.  . tamsulosin (FLOMAX) 0.4 MG CAPS capsule Take 0.4 mg by mouth daily.  Marland Kitchen thiamine 100 MG tablet Take 100 mg by mouth daily.   No facility-administered encounter medications on file as of 04/23/2017.     Review of Systems  GENERAL: No change in appetite, no fatigue, no weight changes, no fever, chills or weakness MOUTH and THROAT: Denies oral discomfort, gingival pain or bleeding RESPIRATORY: no cough, SOB, DOE, wheezing, hemoptysis CARDIAC: No chest pain, edema or palpitations GI: No abdominal pain, diarrhea, constipation, heart burn, nausea or vomiting GU: Denies dysuria, or discharge PSYCHIATRIC: Denies feelings of depression or anxiety. No report of hallucinations, insomnia, paranoia, or agitation   Immunization History  Administered Date(s) Administered  . Influenza-Unspecified 01/27/2017  . Pneumococcal-Unspecified 04/29/2014   Pertinent  Health Maintenance Due  Topic Date Due  . PNA vac Low Risk Adult (2 of 2 - PCV13) 04/30/2015  . INFLUENZA VACCINE  Completed      Vitals:   04/23/17 1045  BP: 128/66  Pulse: 96  Resp: 17  Temp: (!) 97.5 F (36.4 C)  TempSrc: Oral  SpO2: 99%  Weight: 178 lb (80.7 kg)  Height: 5\' 9"  (1.753 m)   Body mass index is 26.29 kg/m.  Physical Exam  GENERAL APPEARANCE: Well nourished. In no acute distress. Normal body habitus SKIN:  Skin is warm and dry.  MOUTH and THROAT: Lips are without lesions. Oral mucosa is moist and without lesions. RESPIRATORY: Breathing is even & unlabored, BS CTAB CARDIAC: RRR, no murmur,no extra heart sounds, no edema GI: Abdomen soft, normal BS, no masses, no tenderness, no hepatomegaly, no splenomegaly Gi:  Has foley catheter with amber colored urine in urine bag EXTREMITIES: Able to move X 4 extremities, has cast on lower  leg/foot PSYCHIATRIC: Alert and oriented X 3. Affect and behavior are appropriate   Labs reviewed: Recent Labs    04/17/17 0242 04/18/17 0605 04/19/17 0741  NA 137 137 139  K 3.6 3.9 3.6  CL 109 106 111  CO2 21* 19* 23  GLUCOSE 104* 124* 91  BUN 35* 29* 27*  CREATININE 1.25* 1.43* 1.36*  CALCIUM 8.8* 9.4 8.5*   Recent Labs    03/27/17 04/16/17 1747  AST 18 23  ALT 15 21  ALKPHOS 71 71  BILITOT  --  0.5  PROT  --  6.0*  ALBUMIN  --  3.4*   Recent Labs    04/16/17 1747  04/17/17 1957 04/18/17 0605 04/19/17 0741  WBC 14.3*   < > 16.7* 18.1* 9.9  NEUTROABS 12.1*  --   --   --   --   HGB 7.9*   < > 9.7* 10.2* 8.5*  HCT 24.5*   < > 29.8* 32.2* 26.8*  MCV 102.9*   < > 95.2 97.9 98.5  PLT 152   < > 137* 155 111*   < > = values in this interval not displayed.    Significant Diagnostic Results in last 30 days:  Dg Chest Port 1 View  Result Date: 04/17/2017 CLINICAL DATA:  Acute anemia. EXAM: PORTABLE CHEST 1 VIEW COMPARISON:  Lung bases from abdominal CT yesterday. FINDINGS: Enlargement of the cardiac silhouette accentuated by low lung volumes a retrocardiac hiatal hernia. No consolidation. No pulmonary edema, pleural effusion or pneumothorax. Calcifications projecting over the right upper arm and lateral hemithorax appear chronic. IMPRESSION: Large cardiac silhouette in part accentuated by large hiatal hernia and low lung volumes. No acute abnormality. Electronically Signed   By: Jeb Levering M.D.   On: 04/17/2017 01:50   Ct Renal Stone Study  Result Date: 04/16/2017 CLINICAL DATA:  Hematuria for 4 days. Listed history of cystoscopy with clot evacuation the last month. EXAM: CT ABDOMEN AND PELVIS WITHOUT CONTRAST TECHNIQUE: Multidetector CT imaging of the abdomen and pelvis was performed following the standard protocol without IV contrast. COMPARISON:  None. FINDINGS: Lower chest: Large hiatal hernia, majority of the stomach is intrathoracic. No evidence of gastric  inflammation. Normal heart size with coronary artery calcifications. Probable intrapulmonary lymph node in the middle lobe. Punctate granuloma in the left lower lobe. Hepatobiliary: Subcentimeter hypodensities in the liver are too small to characterize, scattered calcified granuloma. Calcified gallstones within physiologically distended gallbladder. No pericholecystic inflammation or biliary dilatation. Pancreas: No ductal dilatation or inflammation. Spleen: Spleen is normal in size. No focal abnormality on noncontrast exam. Adrenals/Urinary Tract: No adrenal nodule. Thinning of bilateral renal parenchyma. Lobular soft tissue fullness of the anterior left kidney, incompletely characterized without contrast, may reflect prominent renal tissue versus solid mass. No hydronephrosis or urolithiasis. Foley catheter in the urinary bladder, balloon likely in the prostatic urethra. Bladder filled with heterogeneous lobular material. This is questionably contiguous with the left anterior bladder wall, otherwise no bladder wall attachments are seen. Mild diffuse bladder wall thickening. Probable small right anterior bladder diverticulum. Mild perivesicular stranding. Stomach/Bowel: Large hiatal hernia, majority of the stomach is intrathoracic. Umbilical hernia contains nonobstructed noninflamed small bowel. No bowel inflammation or wall thickening. Multifocal colonic diverticulosis without diverticulitis. Moderate colonic stool burden and tortuosity. Stool distends the rectum. Normal appendix. Vascular/Lymphatic: Aortic and branch atherosclerosis, advanced. No aneurysm. No definite enlarged abdominal or pelvic lymph nodes. Reproductive: Foley catheter balloon in the prostatic urethra. Other: No free air or ascites. Umbilical hernia containing nonobstructed small bowel. Musculoskeletal: There are no acute or suspicious osseous abnormalities. Multilevel degenerative change in the lumbar spine. Prominent Schmorl's node superior  endplate of L4. IMPRESSION: 1. Bladder filled with large amount of heterogeneous lobular material, likely hemorrhage/blood clot. Focal mass fell less likely, however there is questionable continuity with left bladder wall. Recommend correlation with recent cystoscopy. 2. Foley catheter balloon in the prostatic urethra. 3. Lobular soft tissue prominence of the anterior left kidney, likely lobular renal tissue, focal mass difficult to exclude without contrast. 4. Chronic findings not related to hematuria included large hiatal hernia, cholelithiasis, colonic diverticulosis, and advanced aortic atherosclerosis. 5. Umbilical hernia containing nonobstructed noninflamed small bowel. Aortic Atherosclerosis (ICD10-I70.0). Electronically Signed   By: Jeb Levering M.D.   On: 04/16/2017 23:07    Assessment/Plan  1. Gross hematuria - CT scan of the abdomen revealed Foley catheter balloon in the prostatic urethra, urology was consulted and continues bladder irrigation was done until Cornelia Copa has cleared, follow-up with urology, Dr. Vernard Gambles, in 1 week, aspirin was held    2. Osteomyelitis of toe of left foot (HCC) - S/P amputation of left foot fourth ray, we will continue Augmentin 875-125 mg every 12 hours 14 days, follow-up with podiatry, Dr. Erline Hau, on 05/12/17, continue acetaminophen 325 mg give  2 tablets = 650 mg by mouth every 6 hours when necessary   3. Acute blood loss anemia - continue ferrous sulfate 325 mg 1 tab twice a day Lab Results  Component Value Date   HGB 8.5 (L) 04/19/2017     4. Chronic kidney disease, stage III (moderate) (HCC) - will monitor Lab Results  Component Value Date   CREATININE 1.36 (H) 04/19/2017    5. Chronic alcohol use - continue folic acid 1 mg 1 tab daily and vitamin B 1 100 mg 1 tab daily  6. Essential hypertension - continue lisinopril 5 mg 1 tab daily and metoprolol succinate ER 50 mg 1 tab daily   7. Coronary artery disease involving native coronary  artery of native heart without angina pectoris - no chest pain, continue NTG when necessary   8. Dementia without behavioral disturbance, unspecified dementia type - continue supportive care, fall precautions    9. BPH -  continue tamsulosin 0.4 mg 1 capsule daily   10. Mixed hyperlipidemia - continue pravastatin 20 mg 1 tab daily     Family/ staff Communication:  Dis cussed plan of care with patient and charge nurse.  Labs/tests ordered:  For repeat CBC and BMP on 04/25/17  Goals of care:   Short-term rehabilitation    Durenda Age, NP [place] [phone] (Monday-Friday 8:00 a.m. - 5:00 p.m.) [phone] (after hours)

## 2017-04-24 ENCOUNTER — Encounter: Payer: Self-pay | Admitting: Internal Medicine

## 2017-04-24 ENCOUNTER — Non-Acute Institutional Stay (SKILLED_NURSING_FACILITY): Payer: Medicare Other | Admitting: Internal Medicine

## 2017-04-24 DIAGNOSIS — N183 Chronic kidney disease, stage 3 unspecified: Secondary | ICD-10-CM

## 2017-04-24 DIAGNOSIS — M869 Osteomyelitis, unspecified: Secondary | ICD-10-CM

## 2017-04-24 DIAGNOSIS — R310 Gross hematuria: Secondary | ICD-10-CM | POA: Diagnosis not present

## 2017-04-24 DIAGNOSIS — F0390 Unspecified dementia without behavioral disturbance: Secondary | ICD-10-CM | POA: Insufficient documentation

## 2017-04-24 NOTE — Assessment & Plan Note (Signed)
OP F/U with Dr Vernard Gambles

## 2017-04-24 NOTE — Assessment & Plan Note (Signed)
Creatinine is stable ACE inhibitor will be discontinued if creatinine rises

## 2017-04-24 NOTE — Assessment & Plan Note (Signed)
Augmentin will be completed 12/28 Podiatry follow-up with Dr. Prudy Feeler 05/12/17

## 2017-04-24 NOTE — Patient Instructions (Signed)
See assessment and plan under each diagnosis in the problem list and acutely for this visit 

## 2017-04-24 NOTE — Progress Notes (Signed)
NURSING HOME LOCATION:  [facility] ROOM NUMBER:  [id number]  CODE STATUS:  Full Code  PCP:  Ivan Anchors, MD  [place] [id number]  This is a nursing facility follow up for [place] readmission within 30 days  Interim medical record and care since last [place] visit was updated with review of diagnostic studies and change in clinical status since last visit were documented.  HPI: The patient was rehospitalized 12/19-12/22/18 for frank hematuria. The bleeding was originally felt to be related to traumatic placement of Foley catheter. Prior cystoscopy was performed by Dr Vernard Gambles with clot evacuation and fulguration. Apparently the hematuria cleared following discharge but recurred here at the SNF. CT scan documented the Foley balloon to be in the prostatic urethra. Dr. Diona Fanti had Foley replaced ;continuous bladder irrigation resulted in improvement in the blood clot hematuria. Outpatient follow-up with Dr. Vernard Gambles was to be arranged. Rocephin was initiated emperically but was discontinued when the urine culture was negative. His chronic kidney disease stage III was felt to be stable. Flomax was continued for BPH. Augmentin was to be continued until 12/28 for chronic osteomyelitis of the left toe. He is status post fourth ray amputation. Podiatry follow-up with Dr. Prudy Feeler is scheduled 1/14. Predischarge labs revealed BUN 27, creatinine 1.36, calcium 8.5, GFR 43, hemoglobin 8.5, and hematocrit 26.8. Indices were normochromic, normocytic. Glucoses here at the SNF have ranged from a low of 84 up to a high of 107.  Review of systems: He gave the date as 04/25/2007 or 2009. He named the president. His only complaint is being tired which he relates to being nonambulatory due to the foot surgery and subsequent casting. He is on Augmentin,he specifically denies any diarrhea. Affect is somewhat flat but he denies anxiety or depression.  Constitutional: No  fever,significant weight change  Eyes: No redness, discharge, pain, vision change ENT/mouth: No nasal congestion,  purulent discharge, earache,change in hearing ,sore throat  Cardiovascular: No chest pain, palpitations,paroxysmal nocturnal dyspnea, claudication, edema  Respiratory: No cough, sputum production,hemoptysis, DOE , significant snoring,apnea  Gastrointestinal: No heartburn,dysphagia,abdominal pain, nausea / vomiting,rectal bleeding, melena,change in bowels Genitourinary: No dysuria,hematuria, pyuria,  incontinence, nocturia Musculoskeletal: No joint stiffness, joint swelling Dermatologic: No rash, pruritus, change in appearance of skin Neurologic: No dizziness,headache,syncope, seizures, numbness , tingling Psychiatric: No significant anxiety , depression, insomnia, anorexia Endocrine: No change in hair/skin/ nails, excessive thirst, excessive hunger, excessive urination  Hematologic/lymphatic: No significant bruising, lymphadenopathy,abnormal bleeding Allergy/immunology: No itchy/ watery eyes, significant sneezing, urticaria, angioedema  Physical exam:  Pertinent or positive findings: As noted affect is somewhat flat. Appears younger than stated age He has pattern alopecia. Ptosis is greater on the right than the left. Arcus senilis is present. Heart sounds are distant. Bowel sounds are hyperactive. Urine in the Foley is yellow with pinkish thin, wispy clots. The left foot is casted. Pedal pulses are decreased on the right. He has DIP changes of arthritis mainly in the right upper extremity. Cherry angioma are noted over the abdomen.  General appearance:Adequately nourished; no acute distress , increased work of breathing is present.   Lymphatic: No lymphadenopathy about the head, neck, axilla . Eyes: No conjunctival inflammation or lid edema is present. There is no scleral icterus. Ears:  External ear exam shows no significant lesions or deformities.   Nose:  External nasal  examination shows no deformity or inflammation. Nasal mucosa are pink and moist without lesions ,exudates Oral exam: lips and gums are healthy  appearing.There is no oropharyngeal erythema or exudate . Neck:  No thyromegaly, masses, tenderness noted.    Heart:  Normal rate and regular rhythm. S1 and S2 normal without gallop, murmur, click, rub .  Lungs: without wheezes, rhonchi,rales , rubs. Abdomen: Abdomen is soft and nontender with no organomegaly, hernias,masses. GU: deferred  Extremities:  No cyanosis, clubbing,edema  Neurologic exam : Strength equal  in upper & lower extremities Balance,Rhomberg,finger to nose testing could not be completed due to clinical state Deep tendon reflexes are equal in UE Skin: Warm & dry w/o tenting. No significant lesions or rash.  See summary under each active problem in the Problem List with associated updated therapeutic plan

## 2017-04-24 NOTE — Assessment & Plan Note (Addendum)
Request SLUMS screening

## 2017-04-30 LAB — CBC AND DIFFERENTIAL
HEMATOCRIT: 33 — AB (ref 41–53)
Hemoglobin: 10.5 — AB (ref 13.5–17.5)
NEUTROS ABS: 7
PLATELETS: 138 — AB (ref 150–399)
WBC: 10.4

## 2017-05-02 LAB — CBC AND DIFFERENTIAL
HEMATOCRIT: 26 — AB (ref 41–53)
HEMOGLOBIN: 8.8 — AB (ref 13.5–17.5)
Neutrophils Absolute: 7
Platelets: 143 — AB (ref 150–399)
WBC: 10.4

## 2017-05-15 ENCOUNTER — Encounter: Payer: Self-pay | Admitting: Adult Health

## 2017-05-15 ENCOUNTER — Non-Acute Institutional Stay (SKILLED_NURSING_FACILITY): Payer: Medicare Other | Admitting: Adult Health

## 2017-05-15 DIAGNOSIS — F0390 Unspecified dementia without behavioral disturbance: Secondary | ICD-10-CM

## 2017-05-15 DIAGNOSIS — R339 Retention of urine, unspecified: Secondary | ICD-10-CM | POA: Diagnosis not present

## 2017-05-15 DIAGNOSIS — D62 Acute posthemorrhagic anemia: Secondary | ICD-10-CM

## 2017-05-15 DIAGNOSIS — I2510 Atherosclerotic heart disease of native coronary artery without angina pectoris: Secondary | ICD-10-CM

## 2017-05-15 DIAGNOSIS — I10 Essential (primary) hypertension: Secondary | ICD-10-CM | POA: Diagnosis not present

## 2017-05-15 DIAGNOSIS — N400 Enlarged prostate without lower urinary tract symptoms: Secondary | ICD-10-CM | POA: Diagnosis not present

## 2017-05-15 DIAGNOSIS — Z7289 Other problems related to lifestyle: Secondary | ICD-10-CM | POA: Diagnosis not present

## 2017-05-15 DIAGNOSIS — M869 Osteomyelitis, unspecified: Secondary | ICD-10-CM | POA: Diagnosis not present

## 2017-05-15 DIAGNOSIS — N183 Chronic kidney disease, stage 3 unspecified: Secondary | ICD-10-CM

## 2017-05-15 DIAGNOSIS — E782 Mixed hyperlipidemia: Secondary | ICD-10-CM | POA: Diagnosis not present

## 2017-05-15 DIAGNOSIS — F1090 Alcohol use, unspecified, uncomplicated: Secondary | ICD-10-CM

## 2017-05-15 NOTE — Progress Notes (Signed)
Location:  [place] Room Number: [place] of Service:  SNF (31) Provider:  Durenda Age, NP  Patient Care Team: Jason Anchors, MD as PCP - General (Family Medicine)  Extended Emergency Contact Information Primary Emergency Contact: Calderon,Jason Mobile Phone: [phone] Relation: Son  Code Status:  Full Code  Goals of care: Advanced Directive information Advanced Directives 04/18/2017  Does Patient Have a Medical Advance Directive? Yes  Type of Advance Directive -  Does patient want to make changes to medical advance directive? -     Chief Complaint  Patient presents with  . Medical Management of Chronic Issues    Routine [facility] visit    HPI:  Pt is a 84 y.o. male seen today for medical management of chronic diseases.  He is a short-term rehabilitation resident at [facility].  He has a PMH of benign, non-nodular prostatic hyperplasia, stage 3 CKD, angina pectoris, essential HTN, frequent falls, mixed HLD, PUD, and non-traumatic rhabdomyolysis. He was seen in his room today. No reported hematuria. Left foot cast has been removed. He still has dressing on left foot and currently non-weight bearing on LLE. 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      Past Medical History:  Diagnosis Date  . CKD (chronic kidney disease) stage 3, GFR 30-59 ml/min (HCC) 11/17/2015  . Essential hypertension 11/17/2015  . Left foot infection 03/28/2017  . Mixed hyperlipidemia 11/17/2015  . PAD (peripheral artery disease) ([place]) 03/29/2017   Past Surgical History:  Procedure Laterality Date  . CATARACT EXTRACTION    . CORONARY ANGIOPLASTY  1994  . CYSTOSCOPY WITH CLOT EVACUATION/FULGURATION  02/2017  . FOOT DEBRIDEMENT WITH 4TH RAY AMPUTATION Left 03/2017  . KNEE ARTHROSCOPY Right 12/2006  . KNEE ARTHROSCOPY Left 11/2007 and 12/2008    No Known Allergies  Outpatient Encounter Medications as of  05/15/2017  Medication Sig  . acetaminophen (TYLENOL) 325 MG tablet Take 650 mg by mouth every 6 (six) hours as needed for mild pain or moderate pain.  . Amino Acids-Protein Hydrolys (FEEDING SUPPLEMENT, PRO-STAT SUGAR FREE 64,) LIQD Take 30 mLs by mouth daily.  . clotrimazole-betamethasone (LOTRISONE) cream Apply 1 application topically 2 (two) times daily. The cream also has Dipropionate Apply to perineum and groin  . ferrous sulfate 325 (65 FE) MG tablet Take 325 mg by mouth 2 (two) times daily with a meal.  . folic acid (FOLVITE) 1 MG tablet Take 1 mg by mouth daily.  Marland Kitchen lisinopril (PRINIVIL,ZESTRIL) 5 MG tablet Take 5 mg by mouth daily.  . metoprolol succinate (TOPROL-XL) 50 MG 24 hr tablet Take 50 mg by mouth daily. Take with or immediately following a meal.  . nitroGLYCERIN (NITROSTAT) 0.4 MG SL tablet Place 0.4 mg under the tongue every 5 (five) minutes as needed for chest pain.  Marland Kitchen NUTRITIONAL SUPPLEMENT LIQD Take 120 mLs by mouth. MedPass  . pravastatin (PRAVACHOL) 20 MG tablet Take 20 mg by mouth daily.  Marland Kitchen senna-docusate (SENOKOT-S) 8.6-50 MG tablet Take 2 tablets by mouth at bedtime.   . tamsulosin (FLOMAX) 0.4 MG CAPS capsule Take 0.4 mg by mouth daily.  Marland Kitchen thiamine 100 MG tablet Take 100 mg by mouth daily.   No facility-administered encounter medications on file as of 05/15/2017.     Review of Systems  GENERAL: No change in appetite, no fatigue, no weight changes, no fever, chills or weakness MOUTH and THROAT: Denies oral discomfort, gingival pain or bleeding RESPIRATORY: no cough,  SOB, DOE, wheezing, hemoptysis CARDIAC: No chest pain, edema or palpitations GI: No abdominal pain, diarrhea, constipation, heart burn, nausea or vomiting GU: Denies hematuria or discharge PSYCHIATRIC: Denies feelings of depression or anxiety. No report of hallucinations, insomnia, paranoia, or agitation   Immunization History  Administered Date(s) Administered  . Influenza-Unspecified 01/27/2017    . Pneumococcal-Unspecified 04/29/2014   Pertinent  Health Maintenance Due  Topic Date Due  . PNA vac Low Risk Adult (2 of 2 - PCV13) 04/30/2015  . INFLUENZA VACCINE  Completed      Vitals:   05/15/17 1146  BP: 130/62  Pulse: 68  Resp: (!) 28  Temp: 98.2 F (36.8 C)  TempSrc: Oral  SpO2: 96%  Weight: 166 lb 9.6 oz (75.6 kg)  Height: 5\' 9"  (1.753 m)   Body mass index is 24.6 kg/m.  Physical Exam  GENERAL APPEARANCE: Well nourished. In no acute distress. Normal body habitus SKIN:  S/P left foot 4th ray amputation wound is dry with dressing MOUTH and THROAT: Lips are without lesions. Oral mucosa is moist and without lesions.  RESPIRATORY: Breathing is even & unlabored, BS CTAB CARDIAC: RRR, no murmur,no extra heart sounds, no edema GI: Abdomen soft, normal BS, no masses, no tenderness GU:  Has foley catheter draining to urine bag with yellow EXTREMITIES:  Able to move X 4 extremities PSYCHIATRIC: Alert to self and time, disoriented to place. Affect and behavior are appropriate   Labs reviewed: Recent Labs    04/17/17 0242 04/18/17 0605 04/19/17 0741 04/23/17  NA 137 137 139 143  K 3.6 3.9 3.6 4.7  CL 109 106 111  --   CO2 21* 19* 23  --   GLUCOSE 104* 124* 91  --   BUN 35* 29* 27* 42*  CREATININE 1.25* 1.43* 1.36* 1.2  CALCIUM 8.8* 9.4 8.5*  --    Recent Labs    03/27/17 04/16/17 1747  AST 18 23  ALT 15 21  ALKPHOS 71 71  BILITOT  --  0.5  PROT  --  6.0*  ALBUMIN  --  3.4*   Recent Labs    04/16/17 1747  04/17/17 1957 04/18/17 0605 04/19/17 0741 04/23/17 05/02/17  WBC 14.3*   < > 16.7* 18.1* 9.9 19.1 10.4  NEUTROABS 12.1*  --   --   --   --  16 7  HGB 7.9*   < > 9.7* 10.2* 8.5* 10.1* 8.8*  HCT 24.5*   < > 29.8* 32.2* 26.8* 31* 26*  MCV 102.9*   < > 95.2 97.9 98.5  --   --   PLT 152   < > 137* 155 111* 120* 143*   < > = values in this interval not displayed.    Significant Diagnostic Results in last 30 days:  Dg Chest Port 1 View  Result  Date: 04/17/2017 CLINICAL DATA:  Acute anemia. EXAM: PORTABLE CHEST 1 VIEW COMPARISON:  Lung bases from abdominal CT yesterday. FINDINGS: Enlargement of the cardiac silhouette accentuated by low lung volumes a retrocardiac hiatal hernia. No consolidation. No pulmonary edema, pleural effusion or pneumothorax. Calcifications projecting over the right upper arm and lateral hemithorax appear chronic. IMPRESSION: Large cardiac silhouette in part accentuated by large hiatal hernia and low lung volumes. No acute abnormality. Electronically Signed   By: Jeb Levering M.D.   On: 04/17/2017 01:50   Ct Renal Stone Study  Result Date: 04/16/2017 CLINICAL DATA:  Hematuria for 4 days. Listed history of cystoscopy with clot evacuation the  last month. EXAM: CT ABDOMEN AND PELVIS WITHOUT CONTRAST TECHNIQUE: Multidetector CT imaging of the abdomen and pelvis was performed following the standard protocol without IV contrast. COMPARISON:  None. FINDINGS: Lower chest: Large hiatal hernia, majority of the stomach is intrathoracic. No evidence of gastric inflammation. Normal heart size with coronary artery calcifications. Probable intrapulmonary lymph node in the middle lobe. Punctate granuloma in the left lower lobe. Hepatobiliary: Subcentimeter hypodensities in the liver are too small to characterize, scattered calcified granuloma. Calcified gallstones within physiologically distended gallbladder. No pericholecystic inflammation or biliary dilatation. Pancreas: No ductal dilatation or inflammation. Spleen: Spleen is normal in size. No focal abnormality on noncontrast exam. Adrenals/Urinary Tract: No adrenal nodule. Thinning of bilateral renal parenchyma. Lobular soft tissue fullness of the anterior left kidney, incompletely characterized without contrast, may reflect prominent renal tissue versus solid mass. No hydronephrosis or urolithiasis. Foley catheter in the urinary bladder, balloon likely in the prostatic urethra.  Bladder filled with heterogeneous lobular material. This is questionably contiguous with the left anterior bladder wall, otherwise no bladder wall attachments are seen. Mild diffuse bladder wall thickening. Probable small right anterior bladder diverticulum. Mild perivesicular stranding. Stomach/Bowel: Large hiatal hernia, majority of the stomach is intrathoracic. Umbilical hernia contains nonobstructed noninflamed small bowel. No bowel inflammation or wall thickening. Multifocal colonic diverticulosis without diverticulitis. Moderate colonic stool burden and tortuosity. Stool distends the rectum. Normal appendix. Vascular/Lymphatic: Aortic and branch atherosclerosis, advanced. No aneurysm. No definite enlarged abdominal or pelvic lymph nodes. Reproductive: Foley catheter balloon in the prostatic urethra. Other: No free air or ascites. Umbilical hernia containing nonobstructed small bowel. Musculoskeletal: There are no acute or suspicious osseous abnormalities. Multilevel degenerative change in the lumbar spine. Prominent Schmorl's node superior endplate of L4. IMPRESSION: 1. Bladder filled with large amount of heterogeneous lobular material, likely hemorrhage/blood clot. Focal mass fell less likely, however there is questionable continuity with left bladder wall. Recommend correlation with recent cystoscopy. 2. Foley catheter balloon in the prostatic urethra. 3. Lobular soft tissue prominence of the anterior left kidney, likely lobular renal tissue, focal mass difficult to exclude without contrast. 4. Chronic findings not related to hematuria included large hiatal hernia, cholelithiasis, colonic diverticulosis, and advanced aortic atherosclerosis. 5. Umbilical hernia containing nonobstructed noninflamed small bowel. Aortic Atherosclerosis (ICD10-I70.0). Electronically Signed   By: Jeb Levering M.D.   On: 04/16/2017 23:07    Assessment/Plan  1. Osteomyelitis of toe of left foot (HCC) - S/P amputation of  left foot fourth ray, complete bed Augmentin course, recently followed up with Dr. Prudy Feeler, podiatrist, on 05/02/17, nonweightbearing on LLE   2. Acute blood loss anemia - continue ferrous sulfate 325 mg twice a day, check CBC Lab Results  Component Value Date   HGB 8.8 (A) 05/02/2017     3. Chronic kidney disease, stage III (moderate) (HCC) - will monitor, check BMP Lab Results  Component Value Date   CREATININE 1.2 04/23/2017     4. Essential hypertension - well-controlled, continue lisinopril 5 mg daily and metoprolol succinate ER 50 mg 1 tab daily   5. Coronary artery disease involving native coronary artery of native heart without angina pectoris - no complaints of chest pains, continue NTG when necessary, and pravastatin 20 mg 1 tab daily   6. Benign prostatic hyperplasia, unspecified whether lower urinary tract symptoms present - has Foley catheter draining clear yellow urine, continue tamsulosin 0.4 mg 1 capsule daily   7. Mixed dyslipidemia - continue pravastatin 20 mg 1 tab daily   8. Chronic alcohol  use - continue vitamin B1 100 mg 1 tab daily and folic acid 1 mg 1 tablet daily   9. Urinary retention - follows up with urology, currently has Foley catheter   10. Dementia without behavioral disturbance, unspecified dementia type - continue supportive care, fall precautions    Family/ staff Communication:  Discussed plan of care with patient.  Labs/tests ordered:  CBC and BMP  Goals of care:   Short-term care   Durenda Age, NP [facility] [phone] (Monday-Friday 8:00 a.m. - 5:00 p.m.) [phone] (after hours)

## 2017-05-20 ENCOUNTER — Non-Acute Institutional Stay (SKILLED_NURSING_FACILITY): Payer: Medicare Other | Admitting: Adult Health

## 2017-05-20 ENCOUNTER — Encounter: Payer: Self-pay | Admitting: Adult Health

## 2017-05-20 DIAGNOSIS — N183 Chronic kidney disease, stage 3 unspecified: Secondary | ICD-10-CM

## 2017-05-20 DIAGNOSIS — I2510 Atherosclerotic heart disease of native coronary artery without angina pectoris: Secondary | ICD-10-CM

## 2017-05-20 DIAGNOSIS — D62 Acute posthemorrhagic anemia: Secondary | ICD-10-CM | POA: Diagnosis not present

## 2017-05-20 DIAGNOSIS — N400 Enlarged prostate without lower urinary tract symptoms: Secondary | ICD-10-CM | POA: Diagnosis not present

## 2017-05-20 DIAGNOSIS — I10 Essential (primary) hypertension: Secondary | ICD-10-CM

## 2017-05-20 DIAGNOSIS — E782 Mixed hyperlipidemia: Secondary | ICD-10-CM | POA: Diagnosis not present

## 2017-05-20 DIAGNOSIS — F0390 Unspecified dementia without behavioral disturbance: Secondary | ICD-10-CM

## 2017-05-20 DIAGNOSIS — M869 Osteomyelitis, unspecified: Secondary | ICD-10-CM

## 2017-05-20 MED ORDER — LISINOPRIL 5 MG PO TABS: 5.0000 mg | ORAL_TABLET | Freq: Every day | ORAL | 0 refills | Status: AC

## 2017-05-20 MED ORDER — METOPROLOL SUCCINATE ER 50 MG PO TB24: 50.0000 mg | ORAL_TABLET | Freq: Every day | ORAL | 0 refills | Status: AC

## 2017-05-20 MED ORDER — PRAVASTATIN SODIUM 20 MG PO TABS: 20.0000 mg | ORAL_TABLET | Freq: Every day | ORAL | 0 refills | Status: AC

## 2017-05-20 MED ORDER — TAMSULOSIN HCL 0.4 MG PO CAPS: 0.4000 mg | ORAL_CAPSULE | Freq: Every day | ORAL | 0 refills | Status: AC

## 2017-05-20 MED ORDER — NITROGLYCERIN 0.4 MG SL SUBL: 0.4000 mg | SUBLINGUAL_TABLET | SUBLINGUAL | 0 refills | Status: AC | PRN

## 2017-05-20 MED ORDER — CLOTRIMAZOLE-BETAMETHASONE 1-0.05 % EX CREA: 1.0000 "application " | TOPICAL_CREAM | Freq: Two times a day (BID) | CUTANEOUS | 0 refills | Status: AC

## 2017-05-20 NOTE — Progress Notes (Signed)
Location:  [place] Room Number: 664-Q Place of Service:  SNF (31) Provider:  Durenda Age, NP  Patient Care Team: Ivan Anchors, MD as PCP - General (Family Medicine)  Extended Emergency Contact Information Primary Emergency Contact: Boch,Darryl Mobile Phone: [phone] Relation: Son  Code Status:  Full Code  Goals of care: Advanced Directive information Advanced Directives 04/18/2017  Does Patient Have a Medical Advance Directive? Yes  Type of Advance Directive -  Does patient want to make changes to medical advance directive? -     Chief Complaint  Patient presents with  . Discharge Note    Patient will discharge from SNF on 05/21/17    HPI:  Pt is a 84 y.o. male seen today for discharge.  He will discharge to home on 05/21/17 with Home health PT, OT, and Nursing. He was seen today in his room and was happy to be going home.   He has been admitted to [facility] on 04/19/17 from [facility] 04/16/17 to 04/19/17  for traumatic hematuria and placement of a new Foley catheter.CT scan revealed that the Foley balloon was in the prostatic urethra Urology was consulted and recommended Foley catheter to be replaced.He underwent continuous bladder irrigation with improvement in blood clot and hematuria. He was having a short-term rehabilitation at [facility] living and rehabilitation prior to being sent to the hospital.He was recently discharged from the hospital for left foot osteomyelitis S/P left foot fourth ray amputation. He had complication of urinary retention with gross hematuria.It was thought to be due to traumatic Foley catheter placement. Urology was consulted and had continuous bladder irrigation then and was discharged to [facility] for short-term rehabilitation.  Patient was admitted to this facility for short-term rehabilitation after the patient's recent hospitalization.  Patient has completed  SNF rehabilitation and therapy has cleared the patient for discharge.      Past Medical History:  Diagnosis Date  . CKD (chronic kidney disease) stage 3, GFR 30-59 ml/min (HCC) 11/17/2015  . Essential hypertension 11/17/2015  . Left foot infection 03/28/2017  . Mixed hyperlipidemia 11/17/2015  . PAD (peripheral artery disease) ([place]) 03/29/2017   Past Surgical History:  Procedure Laterality Date  . CATARACT EXTRACTION    . CORONARY ANGIOPLASTY  1994  . CYSTOSCOPY WITH CLOT EVACUATION/FULGURATION  02/2017  . FOOT DEBRIDEMENT WITH 4TH RAY AMPUTATION Left 03/2017  . KNEE ARTHROSCOPY Right 12/2006  . KNEE ARTHROSCOPY Left 11/2007 and 12/2008    No Known Allergies  Outpatient Encounter Medications as of 05/20/2017  Medication Sig  . acetaminophen (TYLENOL) 325 MG tablet Take 650 mg by mouth every 6 (six) hours as needed for mild pain or moderate pain.  . Amino Acids-Protein Hydrolys (FEEDING SUPPLEMENT, PRO-STAT SUGAR FREE 64,) LIQD Take 30 mLs by mouth daily.  . clotrimazole-betamethasone (LOTRISONE) cream Apply 1 application topically 2 (two) times daily. The cream also has Dipropionate Apply to perineum and groin  . ferrous sulfate 325 (65 FE) MG tablet Take 325 mg by mouth 2 (two) times daily with a meal.  . folic acid (FOLVITE) 1 MG tablet Take 1 mg by mouth daily.  Marland Kitchen lisinopril (PRINIVIL,ZESTRIL) 5 MG tablet Take 5 mg by mouth daily.  . metoprolol succinate (TOPROL-XL) 50 MG 24 hr tablet Take 50 mg by mouth daily. Take with or immediately following a meal.  . nitroGLYCERIN (NITROSTAT) 0.4 MG SL tablet Place 0.4 mg under the tongue every 5 (five) minutes as needed for chest pain.  Marland Kitchen  NUTRITIONAL SUPPLEMENT LIQD Take 120 mLs by mouth. MedPass  . pravastatin (PRAVACHOL) 20 MG tablet Take 20 mg by mouth daily.  Marland Kitchen senna-docusate (SENOKOT-S) 8.6-50 MG tablet Take 2 tablets by mouth at bedtime.   . tamsulosin (FLOMAX) 0.4 MG CAPS capsule Take 0.4 mg by mouth daily.  Marland Kitchen thiamine 100 MG  tablet Take 100 mg by mouth daily.   No facility-administered encounter medications on file as of 05/20/2017.     Review of Systems  GENERAL: No change in appetite, no fatigue, no weight changes, no fever, chills or weakness MOUTH and THROAT: Denies oral discomfort, gingival pain or bleeding RESPIRATORY: no cough, SOB, DOE, wheezing, hemoptysis CARDIAC: No chest pain, edema or palpitations GI: No abdominal pain, diarrhea, constipation, heart burn, nausea or vomiting PSYCHIATRIC: Denies feelings of depression or anxiety. No report of hallucinations, insomnia, paranoia, or agitation   Immunization History  Administered Date(s) Administered  . Influenza-Unspecified 01/27/2017  . Pneumococcal-Unspecified 04/29/2014   Pertinent  Health Maintenance Due  Topic Date Due  . PNA vac Low Risk Adult (2 of 2 - PCV13) 04/30/2015  . INFLUENZA VACCINE  Completed      Vitals:   05/20/17 1009  BP: (!) 124/59  Pulse: 64  Resp: 20  Temp: 97.7 F (36.5 C)  TempSrc: Oral  SpO2: 97%  Weight: 166 lb 9.6 oz (75.6 kg)  Height: '5\' 9"'  (1.753 m)   Body mass index is 24.6 kg/m.  Physical Exam  GENERAL APPEARANCE: Well nourished. In no acute distress. Normal body habitus SKIN:  Left foot covered with Kerlix dressing and ACE wrap, dry MOUTH and THROAT: Lips are without lesions. Oral mucosa is moist and without lesions.  RESPIRATORY: Breathing is even & unlabored, BS CTAB CARDIAC: RRR, no murmur,no extra heart sounds, no edema GI: Abdomen soft, normal BS, no masses, no tenderness EXTREMITIES:  Able to move X 4 extremities PSYCHIATRIC: Alert to self and time, disoriented to place. Affect and behavior are appropriate   Labs reviewed:  05/19/17  WBC 10.4 hemoglobin 10.5 hematocrit 32.8 MCV 99.2 platelets 138  05/16/17  WBC 14.6 hemoglobin 10.0 hematocrit 29.2  mcv 93.5 platelet123 glucose 90 calcium 8.9 creatinine 1.16sodium 139  K4.3 eGFR 53.83 Recent Labs    04/17/17 0242 04/18/17 0605  04/19/17 0741 04/23/17  NA 137 137 139 143  K 3.6 3.9 3.6 4.7  CL 109 106 111  --   CO2 21* 19* 23  --   GLUCOSE 104* 124* 91  --   BUN 35* 29* 27* 42*  CREATININE 1.25* 1.43* 1.36* 1.2  CALCIUM 8.8* 9.4 8.5*  --    Recent Labs    03/27/17 04/16/17 1747  AST 18 23  ALT 15 21  ALKPHOS 71 71  BILITOT  --  0.5  PROT  --  6.0*  ALBUMIN  --  3.4*   Recent Labs    04/16/17 1747  04/17/17 1957 04/18/17 0605 04/19/17 0741 04/23/17 05/02/17  WBC 14.3*   < > 16.7* 18.1* 9.9 19.1 10.4  NEUTROABS 12.1*  --   --   --   --  16 7  HGB 7.9*   < > 9.7* 10.2* 8.5* 10.1* 8.8*  HCT 24.5*   < > 29.8* 32.2* 26.8* 31* 26*  MCV 102.9*   < > 95.2 97.9 98.5  --   --   PLT 152   < > 137* 155 111* 120* 143*   < > = values in this interval not displayed.  Assessment/Plan  1. Osteomyelitis of toe of left foot (HCC) -  S/P amputation of left fourth ray, follows up with Dr. Prudy Feeler, podiatrist, nonweightbearing on LLE   2. Mixed dyslipidemia - pravastatin (PRAVACHOL) 20 MG tablet; Take 1 tablet (20 mg total) by mouth daily.  Dispense: 30 tablet; Refill: 0  3. Benign prostatic hyperplasia, unspecified whether lower urinary tract symptoms present - his foley catheter has recently been discontinued and he verbalized being able to urinate without any retention - tamsulosin (FLOMAX) 0.4 MG CAPS capsule; Take 1 capsule (0.4 mg total) by mouth daily.  Dispense: 30 capsule; Refill: 0  4. Chronic kidney disease, stage III (moderate) (HCC) - creatinine 1.16, eGFR 53.83, stbale  5. Coronary artery disease involving native coronary artery of native heart without angina pectoris - no complaints of chest pains - nitroGLYCERIN (NITROSTAT) 0.4 MG SL tablet; Place 1 tablet (0.4 mg total) under the tongue every 5 (five) minutes as needed for chest pain.  Dispense: 25 tablet; Refill: 0  6. Essential hypertension - well-controlled - lisinopril (PRINIVIL,ZESTRIL) 5 MG tablet; Take 1 tablet (5 mg total) by mouth  daily.  Dispense: 30 tablet; Refill: 0 - metoprolol succinate (TOPROL-XL) 50 MG 24 hr tablet; Take 1 tablet (50 mg total) by mouth daily. Take with or immediately following a meal.  Dispense: 30 tablet; Refill: 0  7. Acute blood loss anemia - hgb 10.5, stable   8. Dementia without behavioral disturbance, unspecified dementia type - continue supportive care, fall precautions      I have filled out patient's discharge paperwork and e-prescribed medications.  Patient will receive home health PT, OT, and Nursing.  DME provided:  3-in-1, wheelchair, and walker  Total discharge time: Greater than 30 minutes Greater than 50% was spent in counseling and coordination of care.    Discharge time involved coordination of the discharge process with social worker, nursing staff and therapy department. Medical justification for home health services/DME verified.    Durenda Age, NP [facility] and [facility] [phone] (Monday-Friday 8:00 a.m. - 5:00 p.m.) [phone] (after hours)

## 2018-06-15 IMAGING — CT CT RENAL STONE PROTOCOL
2 of 4 series · 15 of 46 positions shown, 17 images · non-contrast
Comparison: None.

CLINICAL DATA: Hematuria for 4 days. Listed history of cystoscopy
with clot evacuation the last month.

EXAM:
CT ABDOMEN AND PELVIS WITHOUT CONTRAST
TECHNIQUE: Multidetector CT imaging of the abdomen and pelvis was performed
following the standard protocol without IV contrast.

[Series 3: stone study 5.0 i30f 2 · axial · 0.94mm/px · z∈[-1114,-689]mm · 12 of 97 slices shown, 14 images]
[im 8/97  soft-tissue]
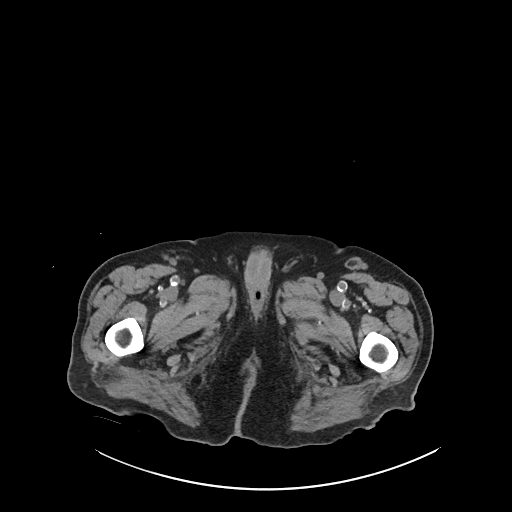
[im 8/97  bone]
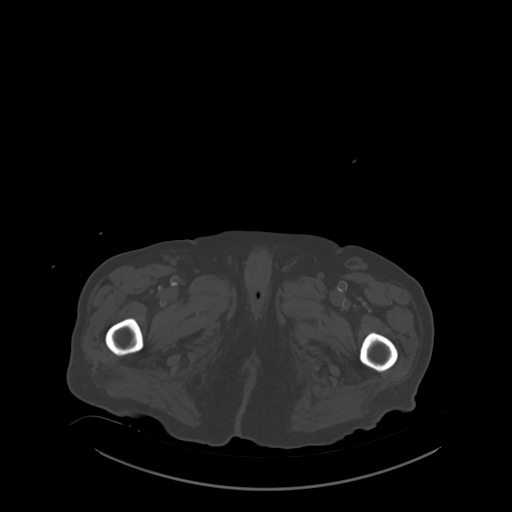
[im 16/97  soft-tissue]
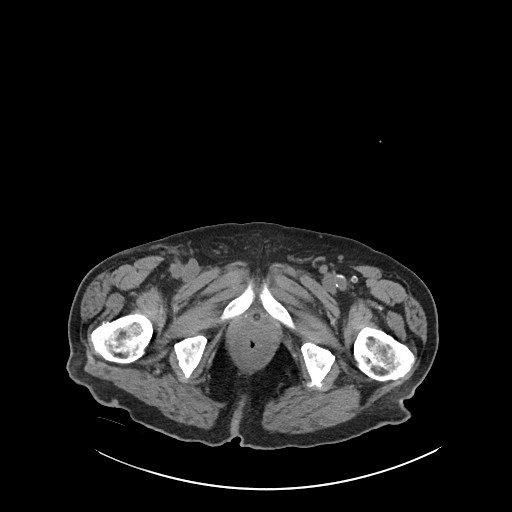
[im 24/97  soft-tissue]
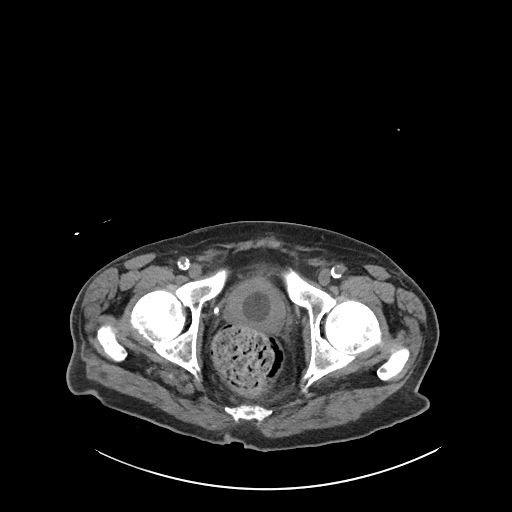
[im 31/97  soft-tissue]
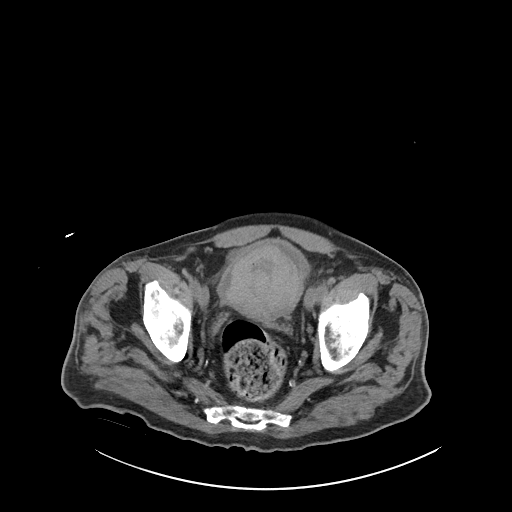
[im 39/97  soft-tissue]
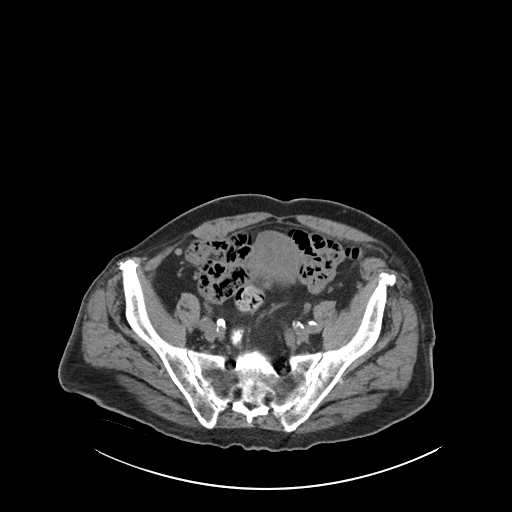
[im 47/97  soft-tissue]
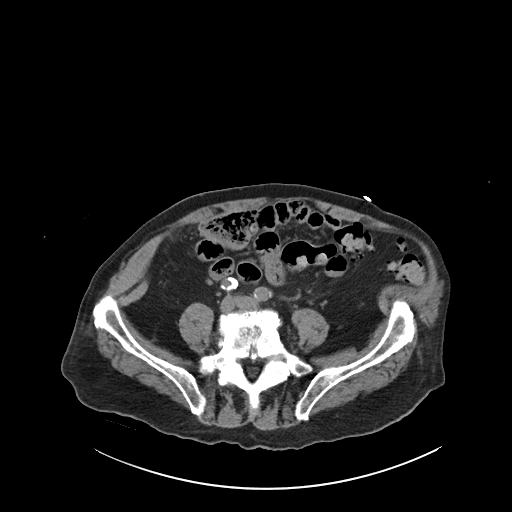
[im 54/97  soft-tissue]
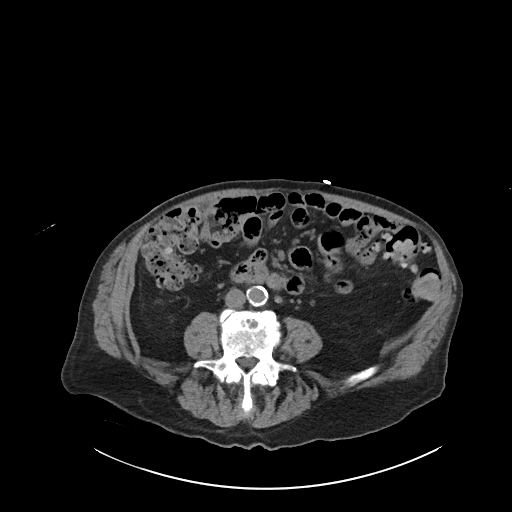
[im 62/97  soft-tissue]
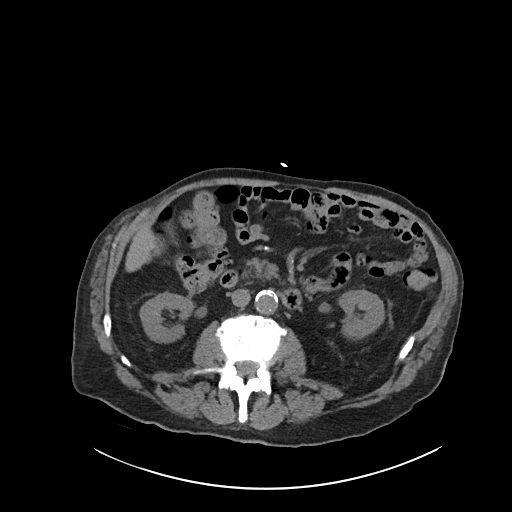
[im 70/97  soft-tissue]
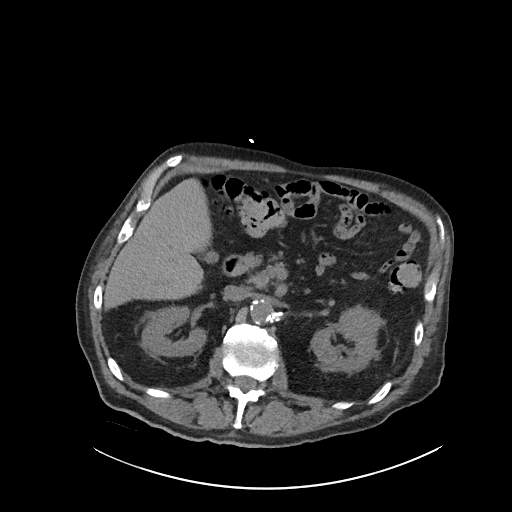
[im 70/97  bone]
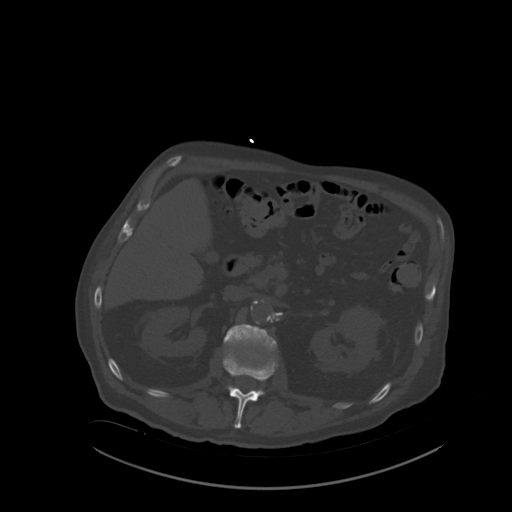
[im 77/97  soft-tissue]
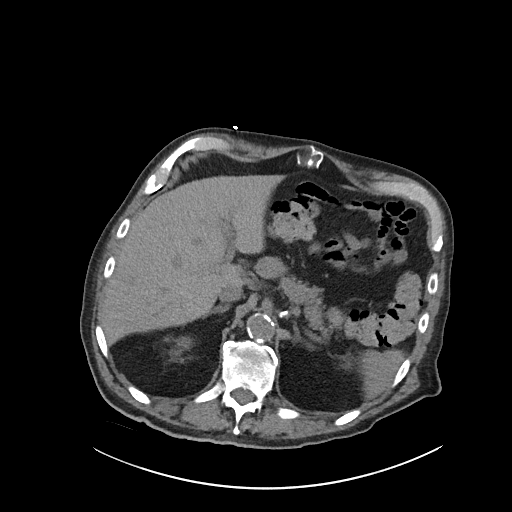
[im 85/97  soft-tissue]
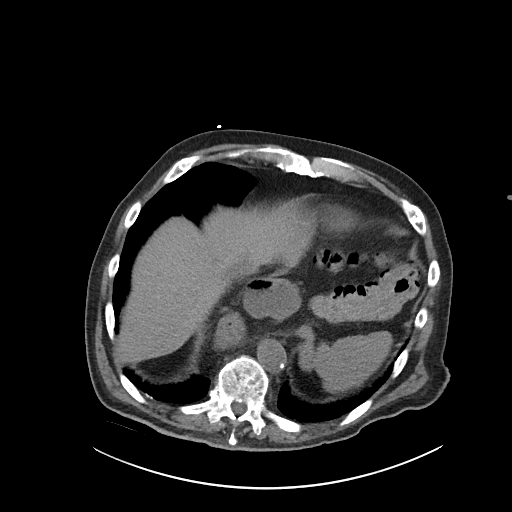
[im 93/97  soft-tissue]
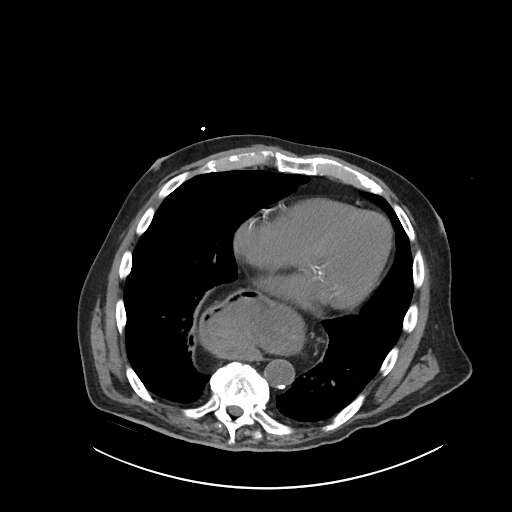

[Series 6: coronal soft tissue · coronal · 0.82mm/px · 3 of 107 slices shown]
[im 36/107  soft-tissue]
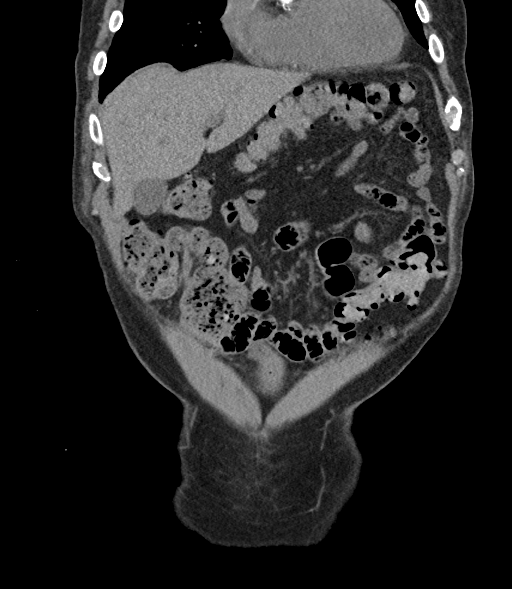
[im 48/107  soft-tissue]
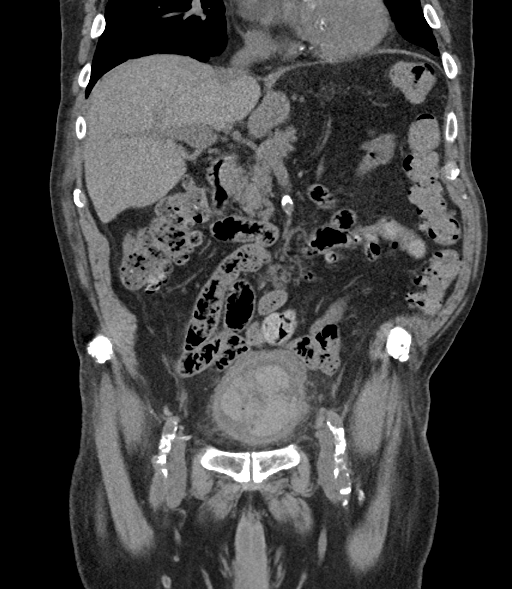
[im 59/107  soft-tissue]
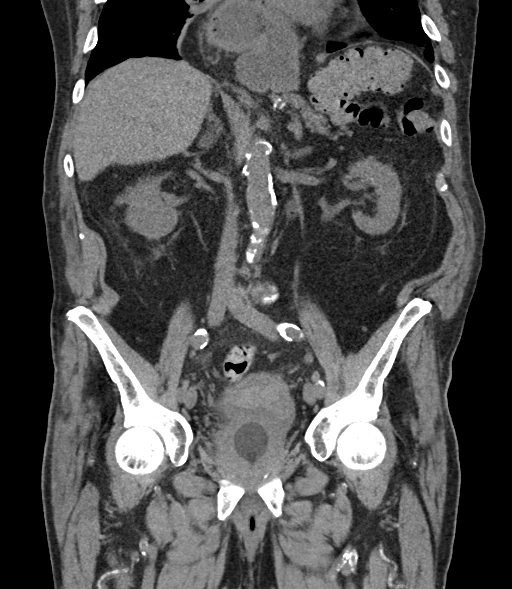

[15 of 46 positions shown; findings below may reference images not displayed]

FINDINGS: Lower chest: Large hiatal hernia, majority of the stomach is
intrathoracic. No evidence of gastric inflammation. Normal heart
size with coronary artery calcifications. Probable intrapulmonary
lymph node in the middle lobe. Punctate granuloma in the left lower
lobe.

Hepatobiliary: Subcentimeter hypodensities in the liver are too
small to characterize, scattered calcified granuloma. Calcified
gallstones within physiologically distended gallbladder. No
pericholecystic inflammation or biliary dilatation.

Pancreas: No ductal dilatation or inflammation.

Spleen: Spleen is normal in size. No focal abnormality on
noncontrast exam.

Adrenals/Urinary Tract: No adrenal nodule. Thinning of bilateral
renal parenchyma. Lobular soft tissue fullness of the anterior left
kidney, incompletely characterized without contrast, may reflect
prominent renal tissue versus solid mass. No hydronephrosis or
urolithiasis.

Foley catheter in the urinary bladder, balloon likely in the
prostatic urethra. Bladder filled with heterogeneous lobular
material. This is questionably contiguous with the left anterior
bladder wall, otherwise no bladder wall attachments are seen. Mild
diffuse bladder wall thickening. Probable small right anterior
bladder diverticulum. Mild perivesicular stranding.

Stomach/Bowel: Large hiatal hernia, majority of the stomach is
intrathoracic. Umbilical hernia contains nonobstructed noninflamed
small bowel. No bowel inflammation or wall thickening. Multifocal
colonic diverticulosis without diverticulitis. Moderate colonic
stool burden and tortuosity. Stool distends the rectum. Normal
appendix.

Vascular/Lymphatic: Aortic and branch atherosclerosis, advanced. No
aneurysm. No definite enlarged abdominal or pelvic lymph nodes.

Reproductive: Foley catheter balloon in the prostatic urethra.

Other: No free air or ascites. Umbilical hernia containing
nonobstructed small bowel.

Musculoskeletal: There are no acute or suspicious osseous
abnormalities. Multilevel degenerative change in the lumbar spine.
Prominent Schmorl's node superior endplate of L4.
IMPRESSION: 1. Bladder filled with large amount of heterogeneous lobular
material, likely hemorrhage/blood clot. Focal mass fell less likely,
however there is questionable continuity with left bladder wall.
Recommend correlation with recent cystoscopy.
2. Foley catheter balloon in the prostatic urethra.
3. Lobular soft tissue prominence of the anterior left kidney,
likely lobular renal tissue, focal mass difficult to exclude without
contrast.
4. Chronic findings not related to hematuria included large hiatal
hernia, cholelithiasis, colonic diverticulosis, and advanced aortic
atherosclerosis.
5. Umbilical hernia containing nonobstructed noninflamed small
bowel.
Aortic Atherosclerosis ([id number]).

## 2018-06-16 IMAGING — DX DG CHEST 1V PORT
1 series · 1 of 1 positions shown · non-contrast
Comparison: Lung bases from abdominal CT yesterday.

CLINICAL DATA: Acute anemia.

EXAM:
PORTABLE CHEST 1 VIEW

[chest ap]
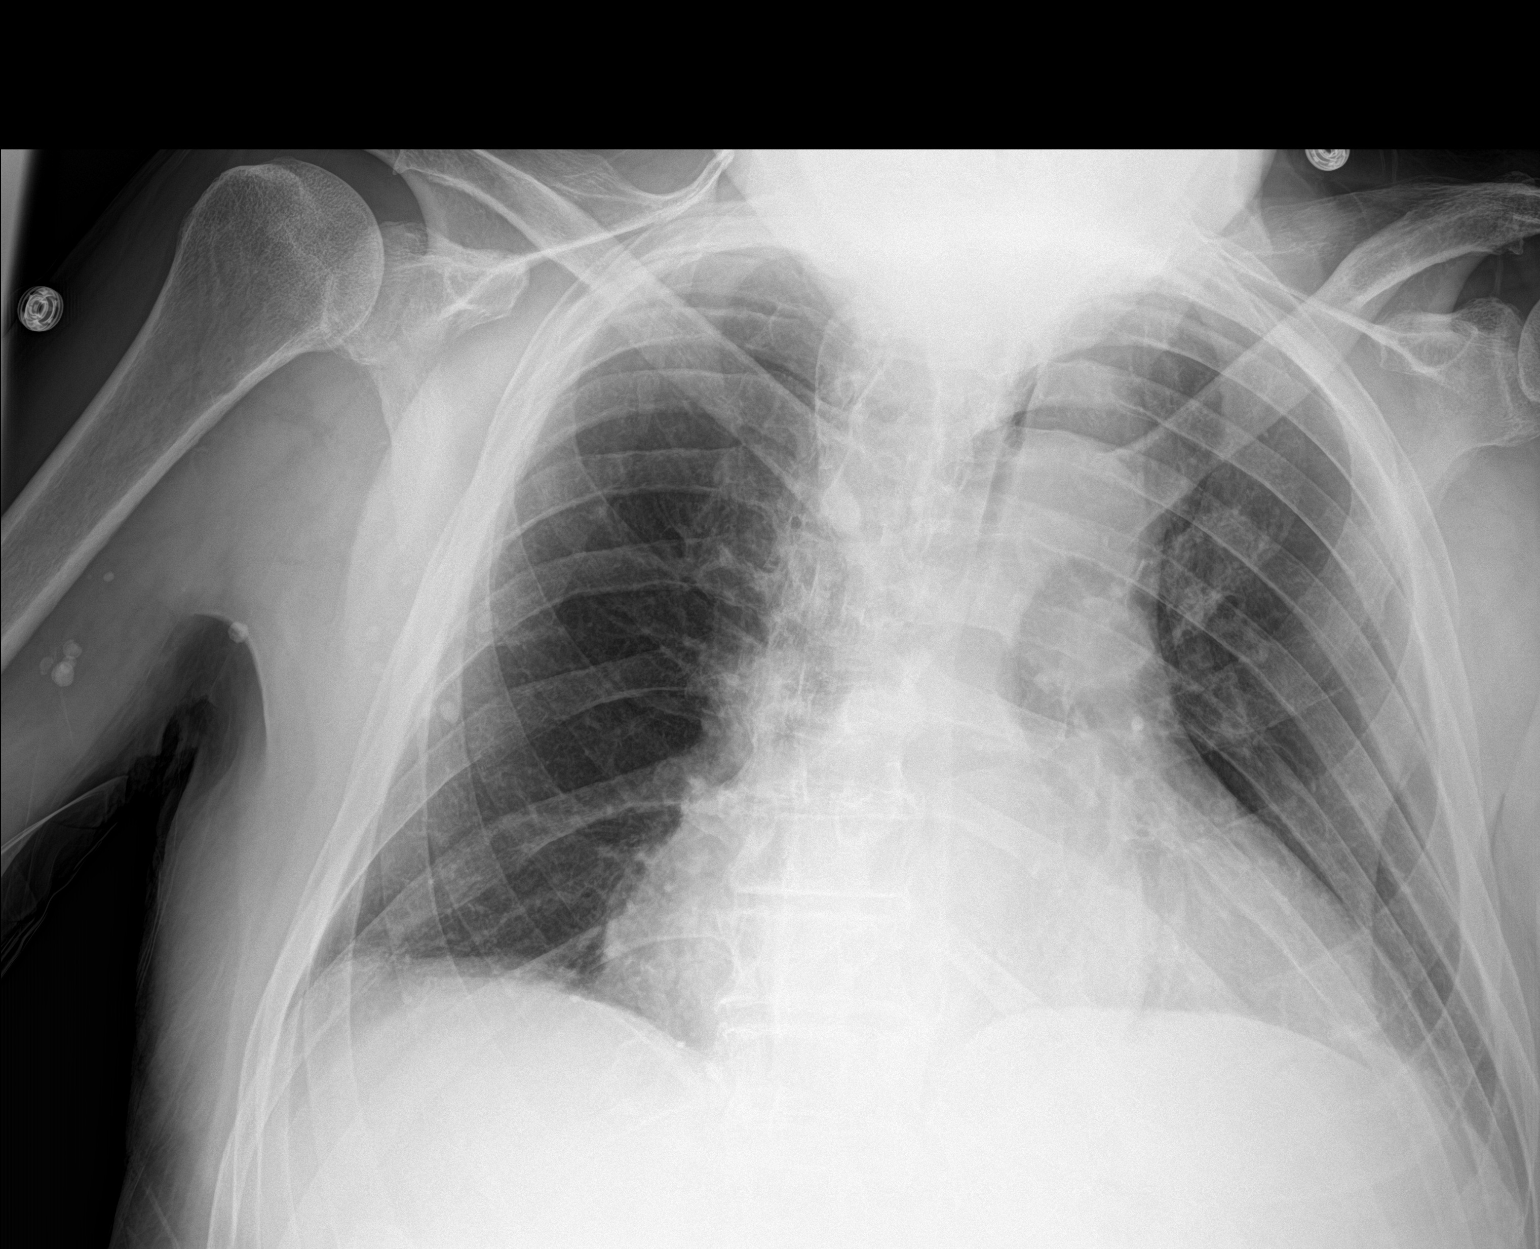

[1 of 1 positions shown; findings below may reference images not displayed]

FINDINGS: Enlargement of the cardiac silhouette accentuated by low lung
volumes a retrocardiac hiatal hernia. No consolidation. No pulmonary
edema, pleural effusion or pneumothorax. Calcifications projecting
over the right upper arm and lateral hemithorax appear chronic.
IMPRESSION: Large cardiac silhouette in part accentuated by large hiatal hernia
and low lung volumes. No acute abnormality.

## 2020-02-09 ENCOUNTER — Encounter (HOSPITAL_BASED_OUTPATIENT_CLINIC_OR_DEPARTMENT_OTHER): Payer: Self-pay

## 2020-02-09 ENCOUNTER — Emergency Department (HOSPITAL_BASED_OUTPATIENT_CLINIC_OR_DEPARTMENT_OTHER): Payer: Medicare Other

## 2020-02-09 ENCOUNTER — Other Ambulatory Visit: Payer: Self-pay

## 2020-02-09 ENCOUNTER — Emergency Department (HOSPITAL_BASED_OUTPATIENT_CLINIC_OR_DEPARTMENT_OTHER)
Admission: EM | Admit: 2020-02-09 | Discharge: 2020-02-09 | Disposition: A | Discharge status: Home / Self Care | Payer: Medicare Other | Attending: Emergency Medicine | Admitting: Emergency Medicine

## 2020-02-09 DIAGNOSIS — I129 Hypertensive chronic kidney disease with stage 1 through stage 4 chronic kidney disease, or unspecified chronic kidney disease: Secondary | ICD-10-CM | POA: Diagnosis not present

## 2020-02-09 DIAGNOSIS — D473 Essential (hemorrhagic) thrombocythemia: Secondary | ICD-10-CM | POA: Insufficient documentation

## 2020-02-09 DIAGNOSIS — R7989 Other specified abnormal findings of blood chemistry: Secondary | ICD-10-CM

## 2020-02-09 DIAGNOSIS — M545 Low back pain, unspecified: Secondary | ICD-10-CM | POA: Insufficient documentation

## 2020-02-09 DIAGNOSIS — M6282 Rhabdomyolysis: Secondary | ICD-10-CM | POA: Diagnosis not present

## 2020-02-09 DIAGNOSIS — T796XXA Traumatic ischemia of muscle, initial encounter: Secondary | ICD-10-CM

## 2020-02-09 DIAGNOSIS — W06XXXA Fall from bed, initial encounter: Secondary | ICD-10-CM | POA: Insufficient documentation

## 2020-02-09 DIAGNOSIS — Z8603 Personal history of neoplasm of uncertain behavior: Secondary | ICD-10-CM | POA: Diagnosis not present

## 2020-02-09 DIAGNOSIS — Z955 Presence of coronary angioplasty implant and graft: Secondary | ICD-10-CM | POA: Insufficient documentation

## 2020-02-09 DIAGNOSIS — D72829 Elevated white blood cell count, unspecified: Secondary | ICD-10-CM | POA: Diagnosis not present

## 2020-02-09 DIAGNOSIS — Z79899 Other long term (current) drug therapy: Secondary | ICD-10-CM | POA: Diagnosis not present

## 2020-02-09 DIAGNOSIS — I2510 Atherosclerotic heart disease of native coronary artery without angina pectoris: Secondary | ICD-10-CM | POA: Insufficient documentation

## 2020-02-09 DIAGNOSIS — N183 Chronic kidney disease, stage 3 unspecified: Secondary | ICD-10-CM | POA: Diagnosis not present

## 2020-02-09 DIAGNOSIS — Y92003 Bedroom of unspecified non-institutional (private) residence as the place of occurrence of the external cause: Secondary | ICD-10-CM | POA: Insufficient documentation

## 2020-02-09 DIAGNOSIS — Z7982 Long term (current) use of aspirin: Secondary | ICD-10-CM | POA: Insufficient documentation

## 2020-02-09 DIAGNOSIS — W19XXXA Unspecified fall, initial encounter: Secondary | ICD-10-CM

## 2020-02-09 DIAGNOSIS — F0390 Unspecified dementia without behavioral disturbance: Secondary | ICD-10-CM | POA: Insufficient documentation

## 2020-02-09 DIAGNOSIS — M25512 Pain in left shoulder: Secondary | ICD-10-CM | POA: Diagnosis present

## 2020-02-09 LAB — URINALYSIS, MICROSCOPIC (REFLEX)

## 2020-02-09 LAB — URINALYSIS, ROUTINE W REFLEX MICROSCOPIC
Bilirubin Urine: NEGATIVE
Glucose, UA: NEGATIVE mg/dL
Ketones, ur: NEGATIVE mg/dL
Leukocytes,Ua: NEGATIVE
Nitrite: NEGATIVE
Protein, ur: >300 mg/dL — AB
Specific Gravity, Urine: >1.03 — ABNORMAL HIGH (ref 1.005–1.030)
pH: 5.5 (ref 5.0–8.0)

## 2020-02-09 LAB — CBC WITH DIFFERENTIAL/PLATELET
Abs Immature Granulocytes: 0.22 10*3/uL — ABNORMAL HIGH (ref 0.00–0.07)
Basophils Absolute: 0.2 10*3/uL — ABNORMAL HIGH (ref 0.0–0.1)
Basophils Relative: 1 %
Eosinophils Absolute: 0 10*3/uL (ref 0.0–0.5)
Eosinophils Relative: 0 %
HCT: 32.7 % — ABNORMAL LOW (ref 39.0–52.0)
Hemoglobin: 10.2 g/dL — ABNORMAL LOW (ref 13.0–17.0)
Immature Granulocytes: 1 %
Lymphocytes Relative: 6 %
Lymphs Abs: 1.6 10*3/uL (ref 0.7–4.0)
MCH: 31.8 pg (ref 26.0–34.0)
MCHC: 31.2 g/dL (ref 30.0–36.0)
MCV: 101.9 fL — ABNORMAL HIGH (ref 80.0–100.0)
Monocytes Absolute: 2.3 10*3/uL — ABNORMAL HIGH (ref 0.1–1.0)
Monocytes Relative: 8 %
Neutro Abs: 24.7 10*3/uL — ABNORMAL HIGH (ref 1.7–7.7)
Neutrophils Relative %: 84 %
Platelets: 857 10*3/uL — ABNORMAL HIGH (ref 150–400)
RBC: 3.21 MIL/uL — ABNORMAL LOW (ref 4.22–5.81)
RDW: 13.8 % (ref 11.5–15.5)
WBC: 29.3 10*3/uL — ABNORMAL HIGH (ref 4.0–10.5)
nRBC: 0 % (ref 0.0–0.2)

## 2020-02-09 LAB — CK
Total CK: 826 U/L — ABNORMAL HIGH (ref 49–397)
Total CK: 619 U/L — ABNORMAL HIGH (ref 49–397)

## 2020-02-09 LAB — COMPREHENSIVE METABOLIC PANEL
ALT: 25 U/L (ref 0–44)
AST: 46 U/L — ABNORMAL HIGH (ref 15–41)
Albumin: 4 g/dL (ref 3.5–5.0)
Alkaline Phosphatase: 67 U/L (ref 38–126)
Anion gap: 13 (ref 5–15)
BUN: 46 mg/dL — ABNORMAL HIGH (ref 8–23)
CO2: 20 mmol/L — ABNORMAL LOW (ref 22–32)
Calcium: 9.5 mg/dL (ref 8.9–10.3)
Chloride: 108 mmol/L (ref 98–111)
Creatinine, Ser: 1.44 mg/dL — ABNORMAL HIGH (ref 0.61–1.24)
GFR, Estimated: 41 mL/min — ABNORMAL LOW (ref >60)
Glucose, Bld: 104 mg/dL — ABNORMAL HIGH (ref 70–99)
Potassium: 4.6 mmol/L (ref 3.5–5.1)
Sodium: 141 mmol/L (ref 135–145)
Total Bilirubin: 0.9 mg/dL (ref 0.3–1.2)
Total Protein: 6.9 g/dL (ref 6.5–8.1)

## 2020-02-09 LAB — BASIC METABOLIC PANEL
Anion gap: 9 (ref 5–15)
BUN: 45 mg/dL — ABNORMAL HIGH (ref 8–23)
CO2: 21 mmol/L — ABNORMAL LOW (ref 22–32)
Calcium: 9.2 mg/dL (ref 8.9–10.3)
Chloride: 108 mmol/L (ref 98–111)
Creatinine, Ser: 1.37 mg/dL — ABNORMAL HIGH (ref 0.61–1.24)
GFR, Estimated: 43 mL/min — ABNORMAL LOW (ref >60)
Glucose, Bld: 116 mg/dL — ABNORMAL HIGH (ref 70–99)
Potassium: 4.6 mmol/L (ref 3.5–5.1)
Sodium: 138 mmol/L (ref 135–145)

## 2020-02-09 MED ORDER — SODIUM CHLORIDE 0.9 % IV BOLUS: 500.0000 mL | Freq: Once | INTRAVENOUS | Status: DC

## 2020-02-09 MED ORDER — SODIUM CHLORIDE 0.9 % IV BOLUS
1000.0000 mL | Freq: Once | INTRAVENOUS | Status: AC
  Administered 2020-02-09: 1000 mL via INTRAVENOUS

## 2020-02-09 NOTE — ED Notes (Addendum)
PT given urinal and aware of need for UA. Unable to void at this time

## 2020-02-09 NOTE — ED Provider Notes (Signed)
[facility] EMERGENCY DEPARTMENT Provider Note   CSN: [id number] Arrival date & time: 02/09/20  4854     History Chief Complaint  Patient presents with  . Fall   LEVEL 5 CAVEAT - DEMENTIA  Jason Calderon is a 86 y.o. male with PMHX Dementia, CKD stage III, HTN, HLD, and anemia who presents to the ED today via EMS from SNF for fall. It was not relayed to EMS whether this was a witnessed vs unwitnessed fall; they report that they did not get much helpful information from the facility where pt is living. Pt appears to be at baseline in terms of his dementia and is oriented to self and place. His complains include left shoulder pain and mid back pain. Pt does not recall falling and was unsure why he was being transported to the hospital. He is not anticoagulated.   The history is provided by the patient, medical records and the EMS personnel.       Past Medical History:  Diagnosis Date  . CKD (chronic kidney disease) stage 3, GFR 30-59 ml/min (HCC) 11/17/2015  . Essential hypertension 11/17/2015  . Left foot infection 03/28/2017  . Mixed hyperlipidemia 11/17/2015  . PAD (peripheral artery disease) ([place]) 03/29/2017    Patient Active Problem List   Diagnosis Date Noted  . Dementia ([place]) 04/24/2017  . Leukocytosis 04/17/2017  . BPH (benign prostatic hyperplasia) 04/17/2017  . Neoplasm of uncertain behavior 62/70/3500  . Peripheral vascular disease ([place]) 04/15/2017  . Acute metabolic encephalopathy 93/81/8299  . Hematuria 04/15/2017  . Acute blood loss anemia 04/15/2017  . Coronary artery disease 04/15/2017  . Chronic kidney disease, stage III (moderate) ([place]) 04/15/2017  . Superficial thrombophlebitis 04/15/2017  . Mixed dyslipidemia 04/15/2017  . Osteomyelitis of toe of left foot ([place]) 04/14/2017    Past Surgical History:  Procedure Laterality Date  . CATARACT EXTRACTION    . CORONARY ANGIOPLASTY  1994  . CYSTOSCOPY WITH CLOT EVACUATION/FULGURATION  02/2017  .  FOOT DEBRIDEMENT WITH 4TH RAY AMPUTATION Left 03/2017  . KNEE ARTHROSCOPY Right 12/2006  . KNEE ARTHROSCOPY Left 11/2007 and 12/2008       Family History  Problem Relation Age of Onset  . GI problems Mother   . Breast cancer Sister   . Irritable bowel syndrome Sister   . Lymphoma Sister   . Sudden death Brother     Social History   Tobacco Use  . Smoking status: Never Smoker  . Smokeless tobacco: Never Used  Vaping Use  . Vaping Use: Never used  Substance Use Topics  . Alcohol use: Not Currently    Alcohol/week: 9.0 standard drinks    Types: 5 Glasses of wine, 4 Cans of beer per week  . Drug use: Never    Home Medications Prior to Admission medications   Medication Sig Start Date End Date Taking? Authorizing Provider  acetaminophen (TYLENOL) 325 MG tablet Take 650 mg by mouth every 6 (six) hours as needed for mild pain or moderate pain.    [provider]  Amino Acids-Protein Hydrolys (FEEDING SUPPLEMENT, PRO-STAT SUGAR FREE 64,) LIQD Take 30 mLs by mouth daily.    [provider]  Aspirin Buf,CaCarb-MgCarb-MgO, 81 MG TABS Take by mouth.    [provider]  clotrimazole-betamethasone (LOTRISONE) cream Apply 1 application topically 2 (two) times daily. The cream also has Dipropionate Apply to perineum and groin 05/20/17   Medina-Vargas, Monina C, NP  ferrous sulfate 325 (65 FE) MG tablet Take 325 mg by  mouth 2 (two) times daily with a meal.    [provider]  folic acid (FOLVITE) 1 MG tablet Take 1 mg by mouth daily.    [provider]  lisinopril (PRINIVIL,ZESTRIL) 5 MG tablet Take 1 tablet (5 mg total) by mouth daily. 05/20/17   Medina-Vargas, Monina C, NP  metoprolol succinate (TOPROL-XL) 50 MG 24 hr tablet Take 1 tablet (50 mg total) by mouth daily. Take with or immediately following a meal. 05/20/17   Medina-Vargas, Monina C, NP  Multiple Vitamins-Minerals (PRESERVISION AREDS) CAPS Take by mouth.    [provider]   nitroGLYCERIN (NITROSTAT) 0.4 MG SL tablet Place 1 tablet (0.4 mg total) under the tongue every 5 (five) minutes as needed for chest pain. 05/20/17   Medina-Vargas, Monina C, NP  NUTRITIONAL SUPPLEMENT LIQD Take 120 mLs by mouth. [employer]    [provider]  pravastatin (PRAVACHOL) 20 MG tablet Take 1 tablet (20 mg total) by mouth daily. 05/20/17   Medina-Vargas, Monina C, NP  senna-docusate (SENOKOT-S) 8.6-50 MG tablet Take 2 tablets by mouth at bedtime.     [provider]  tamsulosin (FLOMAX) 0.4 MG CAPS capsule Take 1 capsule (0.4 mg total) by mouth daily. 05/20/17   Medina-Vargas, Monina C, NP  thiamine 100 MG tablet Take 100 mg by mouth daily.    [provider]    Allergies    Patient has no known allergies.  Review of Systems   Review of Systems  Unable to perform ROS: Dementia  Musculoskeletal: Positive for arthralgias.  Skin: Negative for wound.    Physical Exam Updated Vital Signs BP (!) 168/79 (BP Location: Right Arm)   Pulse 68   Resp 18   SpO2 100%   Physical Exam Vitals and nursing note reviewed.  Constitutional:      Appearance: He is not ill-appearing or diaphoretic.     Comments: Frail elderly male  HENT:     Head: Normocephalic and atraumatic.     Comments: No raccoon's sign or battle's sign. Negative hemotympanum bilaterally.     Right Ear: Tympanic membrane normal.     Left Ear: Tympanic membrane normal.  Eyes:     Extraocular Movements: Extraocular movements intact.     Conjunctiva/sclera: Conjunctivae normal.     Pupils: Pupils are equal, round, and reactive to light.  Jason:     Comments: C collar in place Cardiovascular:     Rate and Rhythm: Normal rate and regular rhythm.     Pulses: Normal pulses.  Pulmonary:     Effort: Pulmonary effort is normal.     Breath sounds: Normal breath sounds. No wheezing, rhonchi or rales.  Abdominal:     Palpations: Abdomen is soft.     Tenderness: There is no abdominal tenderness. There  is no guarding or rebound.  Musculoskeletal:     Cervical back: Jason supple.     Comments: + Midline T spine TTP; no step offs or deformities palpated. No midline L spine TTP. ROM intact to back.   Pelvis stable.   No deformity or ecchymosis appreciated to left shoulder however diffusely TTP. ROM limited s/2 pain. No tenderness distally. 2+ radial pulse.   Skin:    General: Skin is warm and dry.  Neurological:     General: No focal deficit present.     Mental Status: He is alert. Mental status is at baseline.     Cranial Nerves: No cranial nerve deficit.     ED Results /  Procedures / Treatments   Labs (all labs ordered are listed, Jason only abnormal results are displayed) Labs Reviewed  COMPREHENSIVE METABOLIC PANEL - Abnormal; Notable for the following components:      Result Value   CO2 20 (*)    Glucose, Bld 104 (*)    BUN 46 (*)    Creatinine, Ser 1.44 (*)    AST 46 (*)    GFR, Estimated 41 (*)    All other components within normal limits  CBC WITH DIFFERENTIAL/PLATELET - Abnormal; Notable for the following components:   WBC 29.3 (*)    RBC 3.21 (*)    Hemoglobin 10.2 (*)    HCT 32.7 (*)    MCV 101.9 (*)    Platelets 857 (*)    Neutro Abs 24.7 (*)    Monocytes Absolute 2.3 (*)    Basophils Absolute 0.2 (*)    Abs Immature Granulocytes 0.22 (*)    All other components within normal limits  CK - Abnormal; Notable for the following components:   Total CK 826 (*)    All other components within normal limits  URINALYSIS, ROUTINE W REFLEX MICROSCOPIC - Abnormal; Notable for the following components:   Specific Gravity, Urine >1.030 (*)    Hgb urine dipstick MODERATE (*)    Protein, ur >300 (*)    All other components within normal limits  CK - Abnormal; Notable for the following components:   Total CK 619 (*)    All other components within normal limits  BASIC METABOLIC PANEL - Abnormal; Notable for the following components:   CO2 21 (*)    Glucose, Bld 116 (*)     BUN 45 (*)    Creatinine, Ser 1.37 (*)    GFR, Estimated 43 (*)    All other components within normal limits  URINALYSIS, MICROSCOPIC (REFLEX) - Abnormal; Notable for the following components:   Bacteria, UA MANY (*)    All other components within normal limits    EKG None  Jason CT Head Wo Contrast  Result Date: 02/09/2020 CLINICAL DATA:  86 year old male with history of Calderon from a fall from bed. Head Calderon, Jason and back pain. EXAM: CT HEAD WITHOUT CONTRAST CT CERVICAL SPINE WITHOUT CONTRAST CT THORACIC SPINE WITHOUT CONTRAST TECHNIQUE: Multidetector CT imaging of the head, cervical spine and thoracic spine was performed following the standard protocol without intravenous contrast. Multiplanar CT image reconstructions of the cervical spine were also generated. COMPARISON:  No priors. FINDINGS: CT HEAD FINDINGS Brain: Moderate cerebral and mild cerebellar atrophy. Patchy and confluent areas of decreased attenuation are noted throughout the deep and periventricular white matter of the cerebral hemispheres bilaterally, compatible with chronic microvascular ischemic disease. No evidence of acute infarction, hemorrhage, hydrocephalus, extra-axial collection or mass lesion/mass effect. Vascular: No hyperdense vessel or unexpected calcification. Skull: Normal. Negative for fracture or focal lesion. Sinuses/Orbits: No acute finding. Other: None. CT CERVICAL SPINE FINDINGS Alignment: 3 mm of anterolisthesis of C7 upon T1, likely chronic. Alignment is otherwise anatomic. Skull base and vertebrae: No acute fracture. No primary bone lesion or focal pathologic process. Soft tissues and spinal canal: No prevertebral fluid or swelling. No visible canal hematoma. Disc levels: Multilevel degenerative disc disease, most severe at C4-C5, C5-C6 and C6-C7. Moderate multilevel facet arthropathy (right greater than left). Upper chest: Unremarkable. Other: None. CT THORACIC SPINE FINDINGS Alignment: Normal.  Vertebrae: No acute fracture. No primary bone lesion or focal pathologic process. Soft tissues and spinal canal: No prevertebral fluid or swelling.  No visible canal hematoma. Disc levels: Multilevel degenerative disc disease throughout the thoracic spine. Visualized chest: Several small pulmonary nodules are noted in the right lung measuring 5 mm or less in size. Azygos lobe (normal anatomical variant) incidentally noted. Other: Large hiatal hernia containing portions of the stomach and mid transverse colon. Aortic atherosclerosis, as well as left main, left anterior descending, left circumflex and right coronary artery calcifications. Severe calcifications of the aortic valve and mitral annulus. IMPRESSION: 1. No evidence of significant acute traumatic injury to the skull, brain, cervical spine or thoracic spine. 2. Moderate cerebral and mild cerebellar atrophy with extensive chronic microvascular ischemic changes in the cerebral white matter, as above. 3. Multilevel degenerative disc disease and spondylosis in the cervical and thoracic spines. 4. Small pulmonary nodules scattered throughout the lungs bilaterally measuring 5 mm or less in size, statistically likely benign. Electronically Signed   By: Vinnie Langton M.D.   On: 02/09/2020 10:52   CT Cervical Spine Wo Contrast  Result Date: 02/09/2020 CLINICAL DATA:  86 year old male with history of Calderon from a fall from bed. Head Calderon, Jason and back pain. EXAM: CT HEAD WITHOUT CONTRAST CT CERVICAL SPINE WITHOUT CONTRAST CT THORACIC SPINE WITHOUT CONTRAST TECHNIQUE: Multidetector CT imaging of the head, cervical spine and thoracic spine was performed following the standard protocol without intravenous contrast. Multiplanar CT image reconstructions of the cervical spine were also generated. COMPARISON:  No priors. FINDINGS: CT HEAD FINDINGS Brain: Moderate cerebral and mild cerebellar atrophy. Patchy and confluent areas of decreased attenuation are noted  throughout the deep and periventricular white matter of the cerebral hemispheres bilaterally, compatible with chronic microvascular ischemic disease. No evidence of acute infarction, hemorrhage, hydrocephalus, extra-axial collection or mass lesion/mass effect. Vascular: No hyperdense vessel or unexpected calcification. Skull: Normal. Negative for fracture or focal lesion. Sinuses/Orbits: No acute finding. Other: None. CT CERVICAL SPINE FINDINGS Alignment: 3 mm of anterolisthesis of C7 upon T1, likely chronic. Alignment is otherwise anatomic. Skull base and vertebrae: No acute fracture. No primary bone lesion or focal pathologic process. Soft tissues and spinal canal: No prevertebral fluid or swelling. No visible canal hematoma. Disc levels: Multilevel degenerative disc disease, most severe at C4-C5, C5-C6 and C6-C7. Moderate multilevel facet arthropathy (right greater than left). Upper chest: Unremarkable. Other: None. CT THORACIC SPINE FINDINGS Alignment: Normal. Vertebrae: No acute fracture. No primary bone lesion or focal pathologic process. Soft tissues and spinal canal: No prevertebral fluid or swelling. No visible canal hematoma. Disc levels: Multilevel degenerative disc disease throughout the thoracic spine. Visualized chest: Several small pulmonary nodules are noted in the right lung measuring 5 mm or less in size. Azygos lobe (normal anatomical variant) incidentally noted. Other: Large hiatal hernia containing portions of the stomach and mid transverse colon. Aortic atherosclerosis, as well as left main, left anterior descending, left circumflex and right coronary artery calcifications. Severe calcifications of the aortic valve and mitral annulus. IMPRESSION: 1. No evidence of significant acute traumatic injury to the skull, brain, cervical spine or thoracic spine. 2. Moderate cerebral and mild cerebellar atrophy with extensive chronic microvascular ischemic changes in the cerebral white matter, as above.  3. Multilevel degenerative disc disease and spondylosis in the cervical and thoracic spines. 4. Small pulmonary nodules scattered throughout the lungs bilaterally measuring 5 mm or less in size, statistically likely benign. Electronically Signed   By: Vinnie Langton M.D.   On: 02/09/2020 10:52   CT Thoracic Spine Wo Contrast  Result Date: 02/09/2020 CLINICAL DATA:  86 year old male  with history of Calderon from a fall from bed. Head Calderon, Jason and back pain. EXAM: CT HEAD WITHOUT CONTRAST CT CERVICAL SPINE WITHOUT CONTRAST CT THORACIC SPINE WITHOUT CONTRAST TECHNIQUE: Multidetector CT imaging of the head, cervical spine and thoracic spine was performed following the standard protocol without intravenous contrast. Multiplanar CT image reconstructions of the cervical spine were also generated. COMPARISON:  No priors. FINDINGS: CT HEAD FINDINGS Brain: Moderate cerebral and mild cerebellar atrophy. Patchy and confluent areas of decreased attenuation are noted throughout the deep and periventricular white matter of the cerebral hemispheres bilaterally, compatible with chronic microvascular ischemic disease. No evidence of acute infarction, hemorrhage, hydrocephalus, extra-axial collection or mass lesion/mass effect. Vascular: No hyperdense vessel or unexpected calcification. Skull: Normal. Negative for fracture or focal lesion. Sinuses/Orbits: No acute finding. Other: None. CT CERVICAL SPINE FINDINGS Alignment: 3 mm of anterolisthesis of C7 upon T1, likely chronic. Alignment is otherwise anatomic. Skull base and vertebrae: No acute fracture. No primary bone lesion or focal pathologic process. Soft tissues and spinal canal: No prevertebral fluid or swelling. No visible canal hematoma. Disc levels: Multilevel degenerative disc disease, most severe at C4-C5, C5-C6 and C6-C7. Moderate multilevel facet arthropathy (right greater than left). Upper chest: Unremarkable. Other: None. CT THORACIC SPINE FINDINGS Alignment:  Normal. Vertebrae: No acute fracture. No primary bone lesion or focal pathologic process. Soft tissues and spinal canal: No prevertebral fluid or swelling. No visible canal hematoma. Disc levels: Multilevel degenerative disc disease throughout the thoracic spine. Visualized chest: Several small pulmonary nodules are noted in the right lung measuring 5 mm or less in size. Azygos lobe (normal anatomical variant) incidentally noted. Other: Large hiatal hernia containing portions of the stomach and mid transverse colon. Aortic atherosclerosis, as well as left main, left anterior descending, left circumflex and right coronary artery calcifications. Severe calcifications of the aortic valve and mitral annulus. IMPRESSION: 1. No evidence of significant acute traumatic injury to the skull, brain, cervical spine or thoracic spine. 2. Moderate cerebral and mild cerebellar atrophy with extensive chronic microvascular ischemic changes in the cerebral white matter, as above. 3. Multilevel degenerative disc disease and spondylosis in the cervical and thoracic spines. 4. Small pulmonary nodules scattered throughout the lungs bilaterally measuring 5 mm or less in size, statistically likely benign. Electronically Signed   By: Vinnie Langton M.D.   On: 02/09/2020 10:52   DG Chest Port 1 View  Result Date: 02/09/2020 CLINICAL DATA:  Back pain, concern for infection EXAM: PORTABLE CHEST 1 VIEW COMPARISON:  04/17/2017 chest radiograph. FINDINGS: Stable cardiomediastinal silhouette with moderate cardiomegaly. No pneumothorax. No pleural effusion. No pulmonary edema. Minimal bibasilar scarring versus atelectasis. No acute consolidative airspace disease. IMPRESSION: 1. Stable moderate cardiomegaly without pulmonary edema. 2. Minimal bibasilar scarring versus atelectasis. Electronically Signed   By: Ilona Sorrel M.D.   On: 02/09/2020 14:12   DG Shoulder Left  Result Date: 02/09/2020 CLINICAL DATA:  Shoulder pain after fall.  EXAM: LEFT SHOULDER - 2+ VIEW COMPARISON:  04/17/2017 FINDINGS: There is no dislocation. No fracture identified. Degenerative changes are identified involving the acromioclavicular and glenohumeral joints. IMPRESSION: 1. No acute findings. 2. Osteoarthritis. Electronically Signed   By: Kerby Moors M.D.   On: 02/09/2020 10:33    Procedures Procedures (including critical care time)  Medications Ordered in ED Medications  sodium chloride 0.9 % bolus 1,000 mL ( Intravenous Stopped 02/09/20 1449)    ED Course  I have reviewed the triage vital signs and the nursing notes.  Pertinent labs &  imaging results that were available during my care of the patient were reviewed by me and considered in my medical decision making (see chart for details).  Clinical Course as of Feb 08 1733  Wed Feb 09, 2020  1100 Pulmonary nodules  CT Thoracic Spine Wo Contrast [MV]  1338 CK Total(!): 826 [MV]  1338 WBC(!): 29.3 [MV]  1338 Platelets(!): 857 [MV]    Clinical Course User Index [MV] Eustaquio Maize, PA-C   MDM Rules/Calculators/A&P                          86 year old male presenting to the ED via SNF for fall. History if limited s/2 patient's dementia as well as lack of information provided to EMS on arrival to SNF. I have attempted to contact SNF twice without success. Pt cannot recall falling however EMS reports it appears pt may have fallen out of bed this morning. He is not anticoagulated. Currently complaints of left shoulder pain and back pain. On exam pt is alert to self and place. He is unable to provide the year or the situation as to why he is here however does appear to be his baseline dementia. He has a C collar in place. No signs of head Calderon however given age will plan for CT head and CT C spine. Pt does have midline T spinal TTP and is currently complaining of mid back pain; will plan for CT T spine as well and DG L shoulder with TTP on exam. Discussed case with attending physician Dr.  Sedonia Small who has evaluated patient as well; have obtained a screening EKG without acute ischemic changes however will hold off on labwork at this time as it is likely pt fell out of bed/had mechanical fall. Will attempt to contact [facility] again for additional information.   Additional information obtained by [facility] staff - pt was found laying on the floor with covers over him this morning. Staff is supposed to round on patients every 2-3 hours during the nighttime however this is not documented. Last time pt was seen was yesterday evening around 8 PM. No complaints yesterday; pt in good spirits. Given this information and unwitnessed fall will plan for CBC, CMP, and CK.   CT scans negative at this time. Xray of shoulder negative. Incidentally CT T spine does show pulmonary nodules that are likely benign. Will mention this to pt for further eval with PCP.   CBC elevated at 29.3 with left shift. Hgb stable at 10.2. Platelets also elevated at 857. Pt without infectious type symptoms however will plan for U/A and CXR for infectious work up.   Hemoglobin  Date Value Ref Range Status  02/09/2020 10.2 (L) 13.0 - 17.0 g/dL Final  05/02/2017 8.8 (A) 13.5 - 17.5 Final  04/30/2017 10.5 (A) 13.5 - 17.5 Final  04/23/2017 10.1 (A) 13.5 - 17.5 Final   BMP with creatinine 1.44 which is mildly elevated compared to baseline of 1.2 and a BUN 46. CK elevated at 826. Will provide 1 L fluid bolus and plan for recheck of CK and BMP. Pt is overall well appearing and wants to go home. Attending physician Dr. Sedonia Small agrees with this if lab work ultimately reassuring given pt's age and current global pandemic.   CXR negative for infection U/A with moderate hgb and protein; no signs of infection. Blood likely from rhabdo.   Repeat BMP with mildly improving creatinine 1.37 and BUN 45.  CK also improving  at 619. Pt continues to lay comfortably in bed; does not appear to have severe myalgias. Will plan to discharge with close  PCP follow up. Son updated on plan with recheck of labs in 1-2 days. He is in agreement with plan and pt stable for discharge.   Lab Results  Component Value Date   CREATININE 1.37 (H) 02/09/2020   CREATININE 1.44 (H) 02/09/2020   CREATININE 1.2 04/23/2017   This note was prepared using Dragon voice recognition software and may include unintentional dictation errors due to the inherent limitations of voice recognition software.  Final Clinical Impression(s) / ED Diagnoses Final diagnoses:  Fall, initial encounter  Traumatic rhabdomyolysis, initial encounter ([place])  Elevated serum creatinine  Leukocytosis, unspecified type  Elevated platelet count    Rx / DC Orders ED Discharge Orders    None       Discharge Instructions     Please increase your water intake for the next couple of days and follow up with PCP for recheck of labs in 1-2 days. They will need to recheck a CK level, BMP to check your creatinine (kidney function), and a CBC to assess your white blood cell count and platelet count. All of these may be elevated in the setting of your fall however it is important to ensure they are decreasing and not increasing.   One of the CT scans did show some pulmonary nodules in your lungs - please follow up with your PCP regarding this too. It is likely benign in nature however something to be monitored on a yearly basis.   Return to the ED IMMEDIATELY for any worsening symptoms including worsening muscle pain, fevers > 100.4, excessive vomiting, not urinating, or any other new/concerning symptoms.        Eustaquio Maize, PA-C 02/09/20 1734    Maudie Flakes, MD 02/10/20 [phone]

## 2020-02-09 NOTE — ED Notes (Signed)
Per EMS son on way.

## 2020-02-09 NOTE — Discharge Instructions (Signed)
Please increase your water intake for the next couple of days and follow up with PCP for recheck of labs in 1-2 days. They will need to recheck a CK level, BMP to check your creatinine (kidney function), and a CBC to assess your white blood cell count and platelet count. All of these may be elevated in the setting of your fall however it is important to ensure they are decreasing and not increasing.   One of the CT scans did show some pulmonary nodules in your lungs - please follow up with your PCP regarding this too. It is likely benign in nature however something to be monitored on a yearly basis.   Return to the ED IMMEDIATELY for any worsening symptoms including worsening muscle pain, fevers > 100.4, excessive vomiting, not urinating, or any other new/concerning symptoms.

## 2020-02-09 NOTE — ED Notes (Signed)
Review D/C papers with pt/son/Julie at [facility], pt states understanding, pt denies questions at this time.

## 2020-02-09 NOTE — ED Triage Notes (Signed)
EMS transport from [place] [place], pt complaint of falling from bed, no known blood thinners, pt complaint of back pain and bilateral shoulder pain.

## 2020-02-10 ENCOUNTER — Other Ambulatory Visit: Payer: Self-pay

## 2020-02-10 ENCOUNTER — Emergency Department (HOSPITAL_BASED_OUTPATIENT_CLINIC_OR_DEPARTMENT_OTHER): Payer: Medicare Other

## 2020-02-10 ENCOUNTER — Encounter (HOSPITAL_BASED_OUTPATIENT_CLINIC_OR_DEPARTMENT_OTHER): Payer: Self-pay | Admitting: Emergency Medicine

## 2020-02-10 ENCOUNTER — Emergency Department (HOSPITAL_BASED_OUTPATIENT_CLINIC_OR_DEPARTMENT_OTHER)
Admission: EM | Admit: 2020-02-10 | Discharge: 2020-02-11 | Disposition: A | Discharge status: Home / Self Care | Payer: Medicare Other | Attending: Emergency Medicine | Admitting: Emergency Medicine

## 2020-02-10 DIAGNOSIS — N183 Chronic kidney disease, stage 3 unspecified: Secondary | ICD-10-CM | POA: Insufficient documentation

## 2020-02-10 DIAGNOSIS — F0390 Unspecified dementia without behavioral disturbance: Secondary | ICD-10-CM | POA: Diagnosis not present

## 2020-02-10 DIAGNOSIS — W19XXXA Unspecified fall, initial encounter: Secondary | ICD-10-CM

## 2020-02-10 DIAGNOSIS — Z79899 Other long term (current) drug therapy: Secondary | ICD-10-CM | POA: Diagnosis not present

## 2020-02-10 DIAGNOSIS — N179 Acute kidney failure, unspecified: Secondary | ICD-10-CM

## 2020-02-10 DIAGNOSIS — Z7982 Long term (current) use of aspirin: Secondary | ICD-10-CM | POA: Diagnosis not present

## 2020-02-10 DIAGNOSIS — M549 Dorsalgia, unspecified: Secondary | ICD-10-CM | POA: Diagnosis present

## 2020-02-10 DIAGNOSIS — W010XXA Fall on same level from slipping, tripping and stumbling without subsequent striking against object, initial encounter: Secondary | ICD-10-CM | POA: Insufficient documentation

## 2020-02-10 DIAGNOSIS — S51811A Laceration without foreign body of right forearm, initial encounter: Secondary | ICD-10-CM

## 2020-02-10 DIAGNOSIS — I129 Hypertensive chronic kidney disease with stage 1 through stage 4 chronic kidney disease, or unspecified chronic kidney disease: Secondary | ICD-10-CM | POA: Diagnosis not present

## 2020-02-10 DIAGNOSIS — S3991XA Unspecified injury of abdomen, initial encounter: Secondary | ICD-10-CM | POA: Diagnosis not present

## 2020-02-10 DIAGNOSIS — I2510 Atherosclerotic heart disease of native coronary artery without angina pectoris: Secondary | ICD-10-CM | POA: Diagnosis not present

## 2020-02-10 DIAGNOSIS — D72829 Elevated white blood cell count, unspecified: Secondary | ICD-10-CM

## 2020-02-10 LAB — CBC WITH DIFFERENTIAL/PLATELET
Abs Immature Granulocytes: 0.12 10*3/uL — ABNORMAL HIGH (ref 0.00–0.07)
Basophils Absolute: 0.2 10*3/uL — ABNORMAL HIGH (ref 0.0–0.1)
Basophils Relative: 1 %
Eosinophils Absolute: 0.2 10*3/uL (ref 0.0–0.5)
Eosinophils Relative: 1 %
HCT: 31.3 % — ABNORMAL LOW (ref 39.0–52.0)
Hemoglobin: 9.5 g/dL — ABNORMAL LOW (ref 13.0–17.0)
Immature Granulocytes: 1 %
Lymphocytes Relative: 7 %
Lymphs Abs: 1.6 10*3/uL (ref 0.7–4.0)
MCH: 31.8 pg (ref 26.0–34.0)
MCHC: 30.4 g/dL (ref 30.0–36.0)
MCV: 104.7 fL — ABNORMAL HIGH (ref 80.0–100.0)
Monocytes Absolute: 1.5 10*3/uL — ABNORMAL HIGH (ref 0.1–1.0)
Monocytes Relative: 7 %
Neutro Abs: 18.6 10*3/uL — ABNORMAL HIGH (ref 1.7–7.7)
Neutrophils Relative %: 83 %
Platelets: 791 10*3/uL — ABNORMAL HIGH (ref 150–400)
RBC: 2.99 MIL/uL — ABNORMAL LOW (ref 4.22–5.81)
RDW: 14.1 % (ref 11.5–15.5)
WBC: 22.1 10*3/uL — ABNORMAL HIGH (ref 4.0–10.5)
nRBC: 0 % (ref 0.0–0.2)

## 2020-02-10 LAB — COMPREHENSIVE METABOLIC PANEL
ALT: 26 U/L (ref 0–44)
AST: 40 U/L (ref 15–41)
Albumin: 4 g/dL (ref 3.5–5.0)
Alkaline Phosphatase: 62 U/L (ref 38–126)
Anion gap: 9 (ref 5–15)
BUN: 56 mg/dL — ABNORMAL HIGH (ref 8–23)
CO2: 23 mmol/L (ref 22–32)
Calcium: 9.5 mg/dL (ref 8.9–10.3)
Chloride: 108 mmol/L (ref 98–111)
Creatinine, Ser: 1.76 mg/dL — ABNORMAL HIGH (ref 0.61–1.24)
GFR, Estimated: 32 mL/min — ABNORMAL LOW (ref >60)
Glucose, Bld: 114 mg/dL — ABNORMAL HIGH (ref 70–99)
Potassium: 4.4 mmol/L (ref 3.5–5.1)
Sodium: 140 mmol/L (ref 135–145)
Total Bilirubin: 0.5 mg/dL (ref 0.3–1.2)
Total Protein: 6.9 g/dL (ref 6.5–8.1)

## 2020-02-10 LAB — URINALYSIS, ROUTINE W REFLEX MICROSCOPIC
Bilirubin Urine: NEGATIVE
Glucose, UA: NEGATIVE mg/dL
Ketones, ur: NEGATIVE mg/dL
Leukocytes,Ua: NEGATIVE
Nitrite: NEGATIVE
Protein, ur: 100 mg/dL — AB
Specific Gravity, Urine: >1.03 — ABNORMAL HIGH (ref 1.005–1.030)
pH: 5.5 (ref 5.0–8.0)

## 2020-02-10 LAB — CK: Total CK: 365 U/L (ref 49–397)

## 2020-02-10 LAB — URINALYSIS, MICROSCOPIC (REFLEX): WBC, UA: NONE SEEN WBC/hpf (ref 0–5)

## 2020-02-10 LAB — LACTIC ACID, PLASMA: Lactic Acid, Venous: 1.3 mmol/L (ref 0.5–1.9)

## 2020-02-10 MED ORDER — CEPHALEXIN 250 MG PO CAPS
500.0000 mg | ORAL_CAPSULE | Freq: Once | ORAL | Status: AC
  Administered 2020-02-10: 500 mg via ORAL
  Filled 2020-02-10: qty 2

## 2020-02-10 MED ORDER — IOHEXOL 300 MG/ML  SOLN
100.0000 mL | Freq: Once | INTRAMUSCULAR | Status: AC | PRN
  Administered 2020-02-10: 75 mL via INTRAVENOUS

## 2020-02-10 MED ORDER — SODIUM CHLORIDE 0.9 % IV BOLUS
1000.0000 mL | Freq: Once | INTRAVENOUS | Status: AC
  Administered 2020-02-10: 1000 mL via INTRAVENOUS

## 2020-02-10 MED ORDER — CEPHALEXIN 500 MG PO CAPS: 500.0000 mg | ORAL_CAPSULE | Freq: Two times a day (BID) | ORAL | 0 refills | Status: AC

## 2020-02-10 NOTE — ED Triage Notes (Signed)
From [facility] , Hx multiple fall , today fall , tripped layng backward , right elbow skin abrasion. Alert and oriented x 3, Hx dementia.

## 2020-02-10 NOTE — ED Triage Notes (Signed)
Pt fell again since yesterday.  Has several old abrasions from yesterday.  Today pt having some neck pain, same as yesterday.  New thoracic back pain today.

## 2020-02-10 NOTE — ED Provider Notes (Signed)
[facility] EMERGENCY DEPARTMENT Provider Note   CSN: [id number] Arrival date & time: 02/10/20  1729     History Chief Complaint  Patient presents with  . Fall    Jason Calderon is a 86 y.o. male history of CKD, hypertension, hyperlipidemia, peripheral artery disease here presenting with fall.  Patient is from nursing home.  Patient had a fall yesterday.  He was seen in the ED and had a white blood cell count of 30.  His CK level was 800 and improved to 600 after IV fluids.  He was not in acute renal failure.  He had a CT head neck and thoracic spine that were unremarkable.  He went back to the facility apparently he had another mechanical fall.  He was noted to have more upper back pain as well.  Patient did not remember how he fell.  Denies passing out.  Denies any chest pain.  The history is provided by the patient.       Past Medical History:  Diagnosis Date  . CKD (chronic kidney disease) stage 3, GFR 30-59 ml/min (HCC) 11/17/2015  . Essential hypertension 11/17/2015  . Left foot infection 03/28/2017  . Mixed hyperlipidemia 11/17/2015  . PAD (peripheral artery disease) ([place]) 03/29/2017    Patient Active Problem List   Diagnosis Date Noted  . Dementia ([place]) 04/24/2017  . Leukocytosis 04/17/2017  . BPH (benign prostatic hyperplasia) 04/17/2017  . Neoplasm of uncertain behavior 76/54/6503  . Peripheral vascular disease ([place]) 04/15/2017  . Acute metabolic encephalopathy 54/65/6812  . Hematuria 04/15/2017  . Acute blood loss anemia 04/15/2017  . Coronary artery disease 04/15/2017  . Chronic kidney disease, stage III (moderate) ([place]) 04/15/2017  . Superficial thrombophlebitis 04/15/2017  . Mixed dyslipidemia 04/15/2017  . Osteomyelitis of toe of left foot ([place]) 04/14/2017    Past Surgical History:  Procedure Laterality Date  . CATARACT EXTRACTION    . CORONARY ANGIOPLASTY  1994  . CYSTOSCOPY WITH CLOT EVACUATION/FULGURATION  02/2017  . FOOT DEBRIDEMENT WITH  4TH RAY AMPUTATION Left 03/2017  . KNEE ARTHROSCOPY Right 12/2006  . KNEE ARTHROSCOPY Left 11/2007 and 12/2008       Family History  Problem Relation Age of Onset  . GI problems Mother   . Breast cancer Sister   . Irritable bowel syndrome Sister   . Lymphoma Sister   . Sudden death Brother     Social History   Tobacco Use  . Smoking status: Never Smoker  . Smokeless tobacco: Never Used  Vaping Use  . Vaping Use: Never used  Substance Use Topics  . Alcohol use: Not Currently    Alcohol/week: 9.0 standard drinks    Types: 5 Glasses of wine, 4 Cans of beer per week  . Drug use: Never    Home Medications Prior to Admission medications   Medication Sig Start Date End Date Taking? Authorizing Provider  acetaminophen (TYLENOL) 325 MG tablet Take 650 mg by mouth every 6 (six) hours as needed for mild pain or moderate pain.    [provider]  Amino Acids-Protein Hydrolys (FEEDING SUPPLEMENT, PRO-STAT SUGAR FREE 64,) LIQD Take 30 mLs by mouth daily.    [provider]  Aspirin Buf,CaCarb-MgCarb-MgO, 81 MG TABS Take by mouth.    [provider]  clotrimazole-betamethasone (LOTRISONE) cream Apply 1 application topically 2 (two) times daily. The cream also has Dipropionate Apply to perineum and groin 05/20/17   Medina-Vargas, Monina C, NP  ferrous sulfate 325 (65 FE) MG tablet Take  325 mg by mouth 2 (two) times daily with a meal.    [provider]  folic acid (FOLVITE) 1 MG tablet Take 1 mg by mouth daily.    [provider]  lisinopril (PRINIVIL,ZESTRIL) 5 MG tablet Take 1 tablet (5 mg total) by mouth daily. 05/20/17   Medina-Vargas, Monina C, NP  metoprolol succinate (TOPROL-XL) 50 MG 24 hr tablet Take 1 tablet (50 mg total) by mouth daily. Take with or immediately following a meal. 05/20/17   Medina-Vargas, Monina C, NP  Multiple Vitamins-Minerals (PRESERVISION AREDS) CAPS Take by mouth.    [provider]  nitroGLYCERIN (NITROSTAT)  0.4 MG SL tablet Place 1 tablet (0.4 mg total) under the tongue every 5 (five) minutes as needed for chest pain. 05/20/17   Medina-Vargas, Monina C, NP  NUTRITIONAL SUPPLEMENT LIQD Take 120 mLs by mouth. [employer]    [provider]  pravastatin (PRAVACHOL) 20 MG tablet Take 1 tablet (20 mg total) by mouth daily. 05/20/17   Medina-Vargas, Monina C, NP  senna-docusate (SENOKOT-S) 8.6-50 MG tablet Take 2 tablets by mouth at bedtime.     [provider]  tamsulosin (FLOMAX) 0.4 MG CAPS capsule Take 1 capsule (0.4 mg total) by mouth daily. 05/20/17   Medina-Vargas, Monina C, NP  thiamine 100 MG tablet Take 100 mg by mouth daily.    [provider]    Allergies    Patient has no known allergies.  Review of Systems   Review of Systems  Musculoskeletal: Positive for back pain.  All other systems reviewed and are negative.   Physical Exam Updated Vital Signs BP (!) 165/72   Pulse (!) 50   Temp 98.2 F (36.8 C) (Oral)   Resp 18   Ht 5\' 8"  (1.727 m)   Wt 81.6 kg   SpO2 98%   BMI 27.37 kg/m   Physical Exam Vitals and nursing note reviewed.  HENT:     Head: Normocephalic.     Comments: No obvious scalp hematoma    Nose: Nose normal.     Mouth/Throat:     Mouth: Mucous membranes are moist.  Eyes:     Extraocular Movements: Extraocular movements intact.     Pupils: Pupils are equal, round, and reactive to light.  Neck:     Comments: C-collar in place Cardiovascular:     Rate and Rhythm: Normal rate and regular rhythm.     Pulses: Normal pulses.     Heart sounds: Normal heart sounds.  Pulmonary:     Effort: Pulmonary effort is normal.     Breath sounds: Normal breath sounds.  Abdominal:     General: Abdomen is flat.     Palpations: Abdomen is soft.  Musculoskeletal:     Comments: Upper back tenderness with some scoliosis.  No obvious deformity.  Skin:    General: Skin is warm.     Capillary Refill: Capillary refill takes less than 2 seconds.   Neurological:     General: No focal deficit present.     Mental Status: He is alert and oriented to person, place, and time.     Comments: No saddle anesthesia.  Patient has normal strength and sensation bilateral upper and lower extremities.  Psychiatric:        Mood and Affect: Mood normal.        Behavior: Behavior normal.     ED Results / Procedures / Treatments   Labs (all labs ordered are listed, but only abnormal results are  displayed) Labs Reviewed  CULTURE, BLOOD (ROUTINE X 2)  CULTURE, BLOOD (ROUTINE X 2)  CBC WITH DIFFERENTIAL/PLATELET  COMPREHENSIVE METABOLIC PANEL  CK  LACTIC ACID, PLASMA  LACTIC ACID, PLASMA    EKG EKG Interpretation  Date/Time:  Thursday February 10 2020 17:32:23 EDT Ventricular Rate:  49 PR Interval:    QRS Duration: 94 QT Interval:  488 QTC Calculation: 441 R Axis:   39 Text Interpretation: Sinus bradycardia No significant change since last tracing Confirmed by Wandra Arthurs [id number] on 02/10/2020 5:52:56 PM   Radiology CT Head Wo Contrast  Result Date: 02/09/2020 CLINICAL DATA:  86 year old male with history of trauma from a fall from bed. Head trauma, neck and back pain. EXAM: CT HEAD WITHOUT CONTRAST CT CERVICAL SPINE WITHOUT CONTRAST CT THORACIC SPINE WITHOUT CONTRAST TECHNIQUE: Multidetector CT imaging of the head, cervical spine and thoracic spine was performed following the standard protocol without intravenous contrast. Multiplanar CT image reconstructions of the cervical spine were also generated. COMPARISON:  No priors. FINDINGS: CT HEAD FINDINGS Brain: Moderate cerebral and mild cerebellar atrophy. Patchy and confluent areas of decreased attenuation are noted throughout the deep and periventricular white matter of the cerebral hemispheres bilaterally, compatible with chronic microvascular ischemic disease. No evidence of acute infarction, hemorrhage, hydrocephalus, extra-axial collection or mass lesion/mass effect. Vascular: No  hyperdense vessel or unexpected calcification. Skull: Normal. Negative for fracture or focal lesion. Sinuses/Orbits: No acute finding. Other: None. CT CERVICAL SPINE FINDINGS Alignment: 3 mm of anterolisthesis of C7 upon T1, likely chronic. Alignment is otherwise anatomic. Skull base and vertebrae: No acute fracture. No primary bone lesion or focal pathologic process. Soft tissues and spinal canal: No prevertebral fluid or swelling. No visible canal hematoma. Disc levels: Multilevel degenerative disc disease, most severe at C4-C5, C5-C6 and C6-C7. Moderate multilevel facet arthropathy (right greater than left). Upper chest: Unremarkable. Other: None. CT THORACIC SPINE FINDINGS Alignment: Normal. Vertebrae: No acute fracture. No primary bone lesion or focal pathologic process. Soft tissues and spinal canal: No prevertebral fluid or swelling. No visible canal hematoma. Disc levels: Multilevel degenerative disc disease throughout the thoracic spine. Visualized chest: Several small pulmonary nodules are noted in the right lung measuring 5 mm or less in size. Azygos lobe (normal anatomical variant) incidentally noted. Other: Large hiatal hernia containing portions of the stomach and mid transverse colon. Aortic atherosclerosis, as well as left main, left anterior descending, left circumflex and right coronary artery calcifications. Severe calcifications of the aortic valve and mitral annulus. IMPRESSION: 1. No evidence of significant acute traumatic injury to the skull, brain, cervical spine or thoracic spine. 2. Moderate cerebral and mild cerebellar atrophy with extensive chronic microvascular ischemic changes in the cerebral white matter, as above. 3. Multilevel degenerative disc disease and spondylosis in the cervical and thoracic spines. 4. Small pulmonary nodules scattered throughout the lungs bilaterally measuring 5 mm or less in size, statistically likely benign. Electronically Signed   By: Vinnie Langton M.D.    On: 02/09/2020 10:52   CT Cervical Spine Wo Contrast  Result Date: 02/09/2020 CLINICAL DATA:  86 year old male with history of trauma from a fall from bed. Head trauma, neck and back pain. EXAM: CT HEAD WITHOUT CONTRAST CT CERVICAL SPINE WITHOUT CONTRAST CT THORACIC SPINE WITHOUT CONTRAST TECHNIQUE: Multidetector CT imaging of the head, cervical spine and thoracic spine was performed following the standard protocol without intravenous contrast. Multiplanar CT image reconstructions of the cervical spine were also generated. COMPARISON:  No priors. FINDINGS: CT HEAD FINDINGS Brain:  Moderate cerebral and mild cerebellar atrophy. Patchy and confluent areas of decreased attenuation are noted throughout the deep and periventricular white matter of the cerebral hemispheres bilaterally, compatible with chronic microvascular ischemic disease. No evidence of acute infarction, hemorrhage, hydrocephalus, extra-axial collection or mass lesion/mass effect. Vascular: No hyperdense vessel or unexpected calcification. Skull: Normal. Negative for fracture or focal lesion. Sinuses/Orbits: No acute finding. Other: None. CT CERVICAL SPINE FINDINGS Alignment: 3 mm of anterolisthesis of C7 upon T1, likely chronic. Alignment is otherwise anatomic. Skull base and vertebrae: No acute fracture. No primary bone lesion or focal pathologic process. Soft tissues and spinal canal: No prevertebral fluid or swelling. No visible canal hematoma. Disc levels: Multilevel degenerative disc disease, most severe at C4-C5, C5-C6 and C6-C7. Moderate multilevel facet arthropathy (right greater than left). Upper chest: Unremarkable. Other: None. CT THORACIC SPINE FINDINGS Alignment: Normal. Vertebrae: No acute fracture. No primary bone lesion or focal pathologic process. Soft tissues and spinal canal: No prevertebral fluid or swelling. No visible canal hematoma. Disc levels: Multilevel degenerative disc disease throughout the thoracic spine. Visualized  chest: Several small pulmonary nodules are noted in the right lung measuring 5 mm or less in size. Azygos lobe (normal anatomical variant) incidentally noted. Other: Large hiatal hernia containing portions of the stomach and mid transverse colon. Aortic atherosclerosis, as well as left main, left anterior descending, left circumflex and right coronary artery calcifications. Severe calcifications of the aortic valve and mitral annulus. IMPRESSION: 1. No evidence of significant acute traumatic injury to the skull, brain, cervical spine or thoracic spine. 2. Moderate cerebral and mild cerebellar atrophy with extensive chronic microvascular ischemic changes in the cerebral white matter, as above. 3. Multilevel degenerative disc disease and spondylosis in the cervical and thoracic spines. 4. Small pulmonary nodules scattered throughout the lungs bilaterally measuring 5 mm or less in size, statistically likely benign. Electronically Signed   By: Vinnie Langton M.D.   On: 02/09/2020 10:52   CT Thoracic Spine Wo Contrast  Result Date: 02/09/2020 CLINICAL DATA:  86 year old male with history of trauma from a fall from bed. Head trauma, neck and back pain. EXAM: CT HEAD WITHOUT CONTRAST CT CERVICAL SPINE WITHOUT CONTRAST CT THORACIC SPINE WITHOUT CONTRAST TECHNIQUE: Multidetector CT imaging of the head, cervical spine and thoracic spine was performed following the standard protocol without intravenous contrast. Multiplanar CT image reconstructions of the cervical spine were also generated. COMPARISON:  No priors. FINDINGS: CT HEAD FINDINGS Brain: Moderate cerebral and mild cerebellar atrophy. Patchy and confluent areas of decreased attenuation are noted throughout the deep and periventricular white matter of the cerebral hemispheres bilaterally, compatible with chronic microvascular ischemic disease. No evidence of acute infarction, hemorrhage, hydrocephalus, extra-axial collection or mass lesion/mass effect. Vascular:  No hyperdense vessel or unexpected calcification. Skull: Normal. Negative for fracture or focal lesion. Sinuses/Orbits: No acute finding. Other: None. CT CERVICAL SPINE FINDINGS Alignment: 3 mm of anterolisthesis of C7 upon T1, likely chronic. Alignment is otherwise anatomic. Skull base and vertebrae: No acute fracture. No primary bone lesion or focal pathologic process. Soft tissues and spinal canal: No prevertebral fluid or swelling. No visible canal hematoma. Disc levels: Multilevel degenerative disc disease, most severe at C4-C5, C5-C6 and C6-C7. Moderate multilevel facet arthropathy (right greater than left). Upper chest: Unremarkable. Other: None. CT THORACIC SPINE FINDINGS Alignment: Normal. Vertebrae: No acute fracture. No primary bone lesion or focal pathologic process. Soft tissues and spinal canal: No prevertebral fluid or swelling. No visible canal hematoma. Disc levels: Multilevel degenerative disc disease  throughout the thoracic spine. Visualized chest: Several small pulmonary nodules are noted in the right lung measuring 5 mm or less in size. Azygos lobe (normal anatomical variant) incidentally noted. Other: Large hiatal hernia containing portions of the stomach and mid transverse colon. Aortic atherosclerosis, as well as left main, left anterior descending, left circumflex and right coronary artery calcifications. Severe calcifications of the aortic valve and mitral annulus. IMPRESSION: 1. No evidence of significant acute traumatic injury to the skull, brain, cervical spine or thoracic spine. 2. Moderate cerebral and mild cerebellar atrophy with extensive chronic microvascular ischemic changes in the cerebral white matter, as above. 3. Multilevel degenerative disc disease and spondylosis in the cervical and thoracic spines. 4. Small pulmonary nodules scattered throughout the lungs bilaterally measuring 5 mm or less in size, statistically likely benign. Electronically Signed   By: Vinnie Langton  M.D.   On: 02/09/2020 10:52   DG Chest Port 1 View  Result Date: 02/09/2020 CLINICAL DATA:  Back pain, concern for infection EXAM: PORTABLE CHEST 1 VIEW COMPARISON:  04/17/2017 chest radiograph. FINDINGS: Stable cardiomediastinal silhouette with moderate cardiomegaly. No pneumothorax. No pleural effusion. No pulmonary edema. Minimal bibasilar scarring versus atelectasis. No acute consolidative airspace disease. IMPRESSION: 1. Stable moderate cardiomegaly without pulmonary edema. 2. Minimal bibasilar scarring versus atelectasis. Electronically Signed   By: Ilona Sorrel M.D.   On: 02/09/2020 14:12   DG Shoulder Left  Result Date: 02/09/2020 CLINICAL DATA:  Shoulder pain after fall. EXAM: LEFT SHOULDER - 2+ VIEW COMPARISON:  04/17/2017 FINDINGS: There is no dislocation. No fracture identified. Degenerative changes are identified involving the acromioclavicular and glenohumeral joints. IMPRESSION: 1. No acute findings. 2. Osteoarthritis. Electronically Signed   By: Kerby Moors M.D.   On: 02/09/2020 10:33    Procedures Procedures (including critical care time)  Medications Ordered in ED Medications  sodium chloride 0.9 % bolus 1,000 mL (has no administration in time range)    ED Course  I have reviewed the triage vital signs and the nursing notes.  Pertinent labs & imaging results that were available during my care of the patient were reviewed by me and considered in my medical decision making (see chart for details).    MDM Rules/Calculators/A&P                         Sandford Diop is a 86 y.o. male here with recurrent fall.  His white blood cell count is 30 yesterday.  Consider underlying malignancy versus infection.  His urinalysis is negative yesterday.  Plan to repeat white blood cell count as well as CK level and will obtain blood cultures and lactate.  We will also get a CT head neck and chest abdomen pelvis.  10:39 PM WBC is 22, down from 30.  Creatinine slightly increased to 1.7  but lactate is normal and CK normal.  Pan scan did not showed any fractures or intra-abdominal or thoracic pathologies.  Patient does have multiple skin tears.  I do not see a decubitus ulcer.  In particular, his skin tear in the right forearm may show early signs of cellulitis.  Son definitely wants him to return to the facility.  Will give a course of Keflex.   Final Clinical Impression(s) / ED Diagnoses Final diagnoses:  Back pain  Back pain    Rx / DC Orders ED Discharge Orders    None       Drenda Freeze, MD 02/10/20 2240

## 2020-02-10 NOTE — Discharge Instructions (Addendum)
You are slightly dehydrated.  Please stay hydrated.  Your white blood cell count is elevated This can be likely from stress from falling or from dehydration or mild skin infection from your multiple falls. Please take Keflex 500 mg twice daily for a week.  You need to have a recheck CBC and BMP in a week.  See your doctor for follow-up   Return to ER if you have another fall, fever, vomiting

## 2020-02-11 NOTE — ED Notes (Signed)
Called number [phone] was told Pt. Was not a resident at this facility.  Called [phone] was told the Pt. Was not a Pt. There and was given a number for another facility.    Called [phone] and no answer...  phone was sent from place to place and the answering machine answered and sent me from place to place.  RN never able to leave a report with any one.  RN had trouble reaching a person and no report given to  A person.

## 2020-02-12 LAB — URINE CULTURE: Culture: NO GROWTH

## 2020-02-15 LAB — CULTURE, BLOOD (ROUTINE X 2)
Culture: NO GROWTH
Culture: NO GROWTH
Special Requests: ADEQUATE
Special Requests: ADEQUATE

## 2020-06-29 ENCOUNTER — Emergency Department (HOSPITAL_BASED_OUTPATIENT_CLINIC_OR_DEPARTMENT_OTHER): Payer: Medicare Other

## 2020-06-29 ENCOUNTER — Emergency Department (HOSPITAL_BASED_OUTPATIENT_CLINIC_OR_DEPARTMENT_OTHER)
Admission: EM | Admit: 2020-06-29 | Discharge: 2020-06-29 | Disposition: A | Discharge status: Home / Self Care | Payer: Medicare Other | Attending: Emergency Medicine | Admitting: Emergency Medicine

## 2020-06-29 ENCOUNTER — Other Ambulatory Visit: Payer: Self-pay

## 2020-06-29 DIAGNOSIS — F0390 Unspecified dementia without behavioral disturbance: Secondary | ICD-10-CM | POA: Insufficient documentation

## 2020-06-29 DIAGNOSIS — S43005A Unspecified dislocation of left shoulder joint, initial encounter: Secondary | ICD-10-CM | POA: Diagnosis not present

## 2020-06-29 DIAGNOSIS — I2510 Atherosclerotic heart disease of native coronary artery without angina pectoris: Secondary | ICD-10-CM | POA: Insufficient documentation

## 2020-06-29 DIAGNOSIS — S51811A Laceration without foreign body of right forearm, initial encounter: Secondary | ICD-10-CM | POA: Insufficient documentation

## 2020-06-29 DIAGNOSIS — Z79899 Other long term (current) drug therapy: Secondary | ICD-10-CM | POA: Insufficient documentation

## 2020-06-29 DIAGNOSIS — Z23 Encounter for immunization: Secondary | ICD-10-CM | POA: Insufficient documentation

## 2020-06-29 DIAGNOSIS — Z7982 Long term (current) use of aspirin: Secondary | ICD-10-CM | POA: Insufficient documentation

## 2020-06-29 DIAGNOSIS — I129 Hypertensive chronic kidney disease with stage 1 through stage 4 chronic kidney disease, or unspecified chronic kidney disease: Secondary | ICD-10-CM | POA: Diagnosis not present

## 2020-06-29 DIAGNOSIS — S51011A Laceration without foreign body of right elbow, initial encounter: Secondary | ICD-10-CM | POA: Diagnosis not present

## 2020-06-29 DIAGNOSIS — W19XXXA Unspecified fall, initial encounter: Secondary | ICD-10-CM | POA: Diagnosis not present

## 2020-06-29 DIAGNOSIS — N183 Chronic kidney disease, stage 3 unspecified: Secondary | ICD-10-CM | POA: Insufficient documentation

## 2020-06-29 DIAGNOSIS — S4992XA Unspecified injury of left shoulder and upper arm, initial encounter: Secondary | ICD-10-CM | POA: Diagnosis present

## 2020-06-29 MED ORDER — LIDOCAINE-EPINEPHRINE (PF) 2 %-1:200000 IJ SOLN: 10.0000 mL | Freq: Once | INTRAMUSCULAR | Status: AC

## 2020-06-29 MED ORDER — ACETAMINOPHEN 500 MG PO TABS
1000.0000 mg | ORAL_TABLET | Freq: Once | ORAL | Status: AC
  Administered 2020-06-29: 1000 mg via ORAL
  Filled 2020-06-29: qty 2

## 2020-06-29 MED ORDER — TETANUS-DIPHTH-ACELL PERTUSSIS 5-2.5-18.5 LF-MCG/0.5 IM SUSY
0.5000 mL | PREFILLED_SYRINGE | Freq: Once | INTRAMUSCULAR | Status: AC
  Administered 2020-06-29: 0.5 mL via INTRAMUSCULAR
  Filled 2020-06-29: qty 0.5

## 2020-06-29 MED ORDER — PROPOFOL 10 MG/ML IV BOLUS
INTRAVENOUS | Status: AC | PRN
  Administered 2020-06-29: 20 mg via INTRAVENOUS
  Administered 2020-06-29: 40 mg via INTRAVENOUS

## 2020-06-29 MED ORDER — FENTANYL CITRATE (PF) 100 MCG/2ML IJ SOLN
50.0000 ug | Freq: Once | INTRAMUSCULAR | Status: AC
  Administered 2020-06-29: 50 ug via INTRAVENOUS
  Filled 2020-06-29: qty 2

## 2020-06-29 MED ORDER — PROPOFOL 10 MG/ML IV BOLUS
0.5000 mg/kg | Freq: Once | INTRAVENOUS | Status: DC
  Filled 2020-06-29: qty 20

## 2020-06-29 MED ORDER — LIDOCAINE-EPINEPHRINE (PF) 2 %-1:200000 IJ SOLN
INTRAMUSCULAR | Status: AC
  Administered 2020-06-29: 10 mL
  Filled 2020-06-29: qty 20

## 2020-06-29 NOTE — ED Notes (Signed)
Being sutured at this time. Will get vitals in little while once they are done

## 2020-06-29 NOTE — Sedation Documentation (Signed)
Xray at bedside for portable xray

## 2020-06-29 NOTE — ED Triage Notes (Signed)
Pt BIB EMS. Report of an unwitnessed fall and was found prone on the floor. Pt with obvious deformity to L shoulder, skin tear to R elbow, and pain to R knee. Unknown LOC. Pt alert, but disoriented to time and events.

## 2020-06-29 NOTE — ED Provider Notes (Signed)
[facility] EMERGENCY DEPARTMENT Provider Note   CSN: [id number] Arrival date & time: 06/29/20  1741     History Chief Complaint  Patient presents with  . Fall  . Shoulder Injury    Left shoulder pain with deformity  . Wound Check    Skin tear to right elbow    Jason Calderon is a 87 y.o. male is a history of dementia, hypertension, CKD stage III, CAD.    Patient is a resident at [facility] skilled nursing facility.  Patient son and power of attorney is at bedside.  He reports that is contacted by [facility] facility they reported that the patient suffered an unwitnessed fall.  Patient was found laying on the floor around 1700-1730.  [facility] was contacted and patient was transported to the emergency department patient son reports that he is at his mental baseline which is alert to person.  He is not taking any blood thinners.  Patient complains of pain to his left shoulder.  Denies any other pain or injuries.  Pain is unsure what happened to causes pain.  Reports that he is right-hand dominant.  HPI     Past Medical History:  Diagnosis Date  . CKD (chronic kidney disease) stage 3, GFR 30-59 ml/min (HCC) 11/17/2015  . Essential hypertension 11/17/2015  . Left foot infection 03/28/2017  . Mixed hyperlipidemia 11/17/2015  . PAD (peripheral artery disease) ([place]) 03/29/2017    Patient Active Problem List   Diagnosis Date Noted  . Dementia ([place]) 04/24/2017  . Leukocytosis 04/17/2017  . BPH (benign prostatic hyperplasia) 04/17/2017  . Neoplasm of uncertain behavior 76/16/0737  . Peripheral vascular disease ([place]) 04/15/2017  . Acute metabolic encephalopathy 10/62/6948  . Hematuria 04/15/2017  . Acute blood loss anemia 04/15/2017  . Coronary artery disease 04/15/2017  . Chronic kidney disease, stage III (moderate) ([place]) 04/15/2017  . Superficial thrombophlebitis 04/15/2017  . Mixed dyslipidemia 04/15/2017  . Osteomyelitis of toe of left foot ([place]) 04/14/2017    Past  Surgical History:  Procedure Laterality Date  . CATARACT EXTRACTION    . CORONARY ANGIOPLASTY  1994  . CYSTOSCOPY WITH CLOT EVACUATION/FULGURATION  02/2017  . FOOT DEBRIDEMENT WITH 4TH RAY AMPUTATION Left 03/2017  . KNEE ARTHROSCOPY Right 12/2006  . KNEE ARTHROSCOPY Left 11/2007 and 12/2008       Family History  Problem Relation Age of Onset  . GI problems Mother   . Breast cancer Sister   . Irritable bowel syndrome Sister   . Lymphoma Sister   . Sudden death Brother     Social History   Tobacco Use  . Smoking status: Never Smoker  . Smokeless tobacco: Never Used  Vaping Use  . Vaping Use: Never used  Substance Use Topics  . Alcohol use: Not Currently    Alcohol/week: 9.0 standard drinks    Types: 5 Glasses of wine, 4 Cans of beer per week  . Drug use: Never    Home Medications Prior to Admission medications   Medication Sig Start Date End Date Taking? Authorizing Provider  acetaminophen (TYLENOL) 325 MG tablet Take 650 mg by mouth every 6 (six) hours as needed for mild pain or moderate pain.    [provider]  Amino Acids-Protein Hydrolys (FEEDING SUPPLEMENT, PRO-STAT SUGAR FREE 64,) LIQD Take 30 mLs by mouth daily.    [provider]  Aspirin Buf,CaCarb-MgCarb-MgO, 81 MG TABS Take by mouth.    [provider]  cephALEXin (KEFLEX) 500 MG capsule Take 1 capsule (500 mg  total) by mouth 2 (two) times daily. 02/10/20   Drenda Freeze, MD  clotrimazole-betamethasone (LOTRISONE) cream Apply 1 application topically 2 (two) times daily. The cream also has Dipropionate Apply to perineum and groin 05/20/17   Medina-Vargas, Monina C, NP  ferrous sulfate 325 (65 FE) MG tablet Take 325 mg by mouth 2 (two) times daily with a meal.    [provider]  folic acid (FOLVITE) 1 MG tablet Take 1 mg by mouth daily.    [provider]  lisinopril (PRINIVIL,ZESTRIL) 5 MG tablet Take 1 tablet (5 mg total) by mouth daily. 05/20/17   Medina-Vargas,  Monina C, NP  metoprolol succinate (TOPROL-XL) 50 MG 24 hr tablet Take 1 tablet (50 mg total) by mouth daily. Take with or immediately following a meal. 05/20/17   Medina-Vargas, Monina C, NP  Multiple Vitamins-Minerals (PRESERVISION AREDS) CAPS Take by mouth.    [provider]  nitroGLYCERIN (NITROSTAT) 0.4 MG SL tablet Place 1 tablet (0.4 mg total) under the tongue every 5 (five) minutes as needed for chest pain. 05/20/17   Medina-Vargas, Monina C, NP  NUTRITIONAL SUPPLEMENT LIQD Take 120 mLs by mouth. [facility]    [provider]  pravastatin (PRAVACHOL) 20 MG tablet Take 1 tablet (20 mg total) by mouth daily. 05/20/17   Medina-Vargas, Monina C, NP  senna-docusate (SENOKOT-S) 8.6-50 MG tablet Take 2 tablets by mouth at bedtime.     [provider]  tamsulosin (FLOMAX) 0.4 MG CAPS capsule Take 1 capsule (0.4 mg total) by mouth daily. 05/20/17   Medina-Vargas, Monina C, NP  thiamine 100 MG tablet Take 100 mg by mouth daily.    [provider]    Allergies    Patient has no known allergies.  Review of Systems   Review of Systems  Unable to perform ROS: Dementia    Physical Exam Updated Vital Signs BP (!) 172/77 (BP Location: Right Arm)   Pulse 72   Temp 98.4 F (36.9 C) (Oral)   Resp 16   SpO2 96%   Physical Exam Vitals and nursing note reviewed.  Constitutional:      General: He is not in acute distress.    Appearance: He is not ill-appearing, toxic-appearing or diaphoretic.  HENT:     Head: Normocephalic and atraumatic.  Eyes:     General: Vision grossly intact. No scleral icterus.       Right eye: No discharge.        Left eye: No discharge.     Extraocular Movements: Extraocular movements intact.     Pupils: Pupils are equal, round, and reactive to light.  Cardiovascular:     Rate and Rhythm: Normal rate.  Pulmonary:     Effort: Pulmonary effort is normal. No respiratory distress.     Breath sounds: No stridor.  Chest:     Chest wall:  No mass, lacerations, deformity, swelling or tenderness.  Abdominal:     General: There is no distension. There are no signs of injury.     Palpations: Abdomen is soft. There is no mass or pulsatile mass.     Tenderness: There is no abdominal tenderness. There is no guarding or rebound.     Hernia: There is no hernia in the umbilical area or ventral area.  Musculoskeletal:     Right shoulder: No swelling, deformity, effusion, laceration, tenderness or bony tenderness. Normal range of motion.     Left shoulder: Deformity, tenderness and bony tenderness present. No swelling or laceration.  Normal range of motion.     Right upper arm: Normal.     Left upper arm: Normal.     Right elbow: Normal.     Left elbow: Normal.     Right forearm: Laceration present. No swelling, edema, deformity, tenderness or bony tenderness.     Left forearm: Normal.     Right wrist: No swelling, deformity, effusion, lacerations, tenderness, bony tenderness or snuff box tenderness. Normal range of motion. Normal pulse.     Left wrist: No swelling, deformity, effusion, lacerations, tenderness, bony tenderness or snuff box tenderness. Normal range of motion. Normal pulse.     Right hand: No swelling, deformity, lacerations, tenderness or bony tenderness. Normal range of motion. Normal strength. Normal sensation. Normal capillary refill.     Left hand: No swelling, deformity, lacerations, tenderness or bony tenderness. Normal range of motion. Normal strength. Normal sensation. Normal capillary refill.     Cervical back: Neck supple. No swelling, edema, deformity, erythema, signs of trauma, lacerations, rigidity, spasms, tenderness or bony tenderness. No pain with movement. Normal range of motion.     Thoracic back: No swelling, edema, deformity, signs of trauma, lacerations, spasms, tenderness or bony tenderness.     Lumbar back: No swelling, edema, deformity, signs of trauma, lacerations, spasms, tenderness or bony  tenderness.     Right lower leg: No edema.     Left lower leg: No edema.     Comments: Decreased range of motion to left shoulder due to complaints of pain; humeral head obviously out of position  5 cm skin tear with jagged edges in L-shaped to right forearm  Patient able to lift and hold bilateral lower extremities in the air against gravity with no difficulty or complaints of pain; tenderness to bilateral lower extremities, no pain to bilateral lower extremities on passive range of motion  Left lower leg noted to be wrapped and Kerlix  Skin:    General: Skin is warm and dry.     Coloration: Skin is not pale.  Neurological:     General: No focal deficit present.     Mental Status: He is alert.  Psychiatric:        Behavior: Behavior is cooperative.     ED Results / Procedures / Treatments   Labs (all labs ordered are listed, but only abnormal results are displayed) Labs Reviewed  COMPREHENSIVE METABOLIC PANEL  CBC WITH DIFFERENTIAL/PLATELET  URINALYSIS, ROUTINE W REFLEX MICROSCOPIC    EKG None  Radiology No results found.  Procedures .Marland KitchenLaceration Repair  Date/Time: 06/29/2020 8:18 PM Performed by: Loni Beckwith, PA-C Authorized by: Loni Beckwith, PA-C   Consent:    Consent obtained:  Verbal   Consent given by:  Healthcare agent   Risks discussed:  Infection, need for additional repair, pain, poor cosmetic result and poor wound healing   Alternatives discussed:  No treatment and delayed treatment Universal protocol:    Procedure explained and questions answered to patient or proxy's satisfaction: yes     Patient identity confirmed:  Verbally with patient and arm band Anesthesia:    Anesthesia method:  Local infiltration   Local anesthetic:  Lidocaine 2% WITH epi Laceration details:    Location:  Shoulder/arm   Shoulder/arm location:  R lower arm   Length (cm):  5   Depth (mm):  5 Pre-procedure details:    Preparation:  Patient was prepped and  draped in usual sterile fashion Exploration:    Hemostasis achieved with:  Tied off  vessels   Contaminated: no   Treatment:    Area cleansed with:  Povidone-iodine   Amount of cleaning:  Standard   Debridement:  Minimal Skin repair:    Repair method:  Sutures   Suture size:  3-0   Wound skin closure material used: vicryl    Suture technique:  Simple interrupted   Number of sutures:  2 Approximation:    Approximation:  Close Repair type:    Repair type:  Intermediate Post-procedure details:    Dressing:  Non-adherent dressing and sterile dressing   Procedure completion:  Tolerated well, no immediate complications    Medications Ordered in ED Medications  fentaNYL (SUBLIMAZE) injection 50 mcg (has no administration in time range)  Tdap (BOOSTRIX) injection 0.5 mL (has no administration in time range)  propofol (DIPRIVAN) 10 mg/mL bolus/IV push 39.3 mg (has no administration in time range)  acetaminophen (TYLENOL) tablet 1,000 mg (1,000 mg Oral Given 06/29/20 1843)  lidocaine-EPINEPHrine (XYLOCAINE W/EPI) 2 %-1:200000 (PF) injection 10 mL (10 mLs Infiltration Given by Other 06/29/20 2013)    ED Course  I have reviewed the triage vital signs and the nursing notes.  Pertinent labs & imaging results that were available during my care of the patient were reviewed by me and considered in my medical decision making (see chart for details).    MDM Rules/Calculators/A&P                          Alert 87 year old male no acute distress, nontoxic-appearing.  Patient alert to person only; degree of dementia.  Patient's son and POA is at bedside reports patient is at his baseline mental status.  Patient son reports that patient suffered an unwitnessed fall at [facility] and was found lying on the floor.  Patient does not recall events of his fall.  Complains of pain to left shoulder.    No tender spinous process, step-off, or deformity noted to cervical, thoracic, or lumbar spine.  Head  atraumatic   Decreased range of motion to left shoulder due to complaints of pain; humeral head obviously out of position  5 cm skin tear with jagged edges in L-shaped to right forearm  Patient able to move right arm with no decreased range of motion or complaints of pain.  Patient able to lift and hold bilateral lower extremities in the air against gravity with no difficulty or complaints of pain; tenderness to bilateral lower extremities, no pain to bilateral lower extremities on passive range of motion  Left lower leg noted to be wrapped and Kerlix.  Son reports that this is done by wound care and is normally in place.  Noncontrast head and cervical spine CT, left shoulder, and chest x-ray ordered and pending.  [place] multiple times but could not speak with a representative.  CT scan showed no acute intracranial abnormality, no acute fracture or malalignment of the spine. Left shoulder x-ray shows anterior glenohumeral dislocation, no evidence of fracture Chest x-ray shows no acute cardiopulmonary disease.  Discussed with patient son that patient's fall may have been due to syncopal episode for possible infection causing weakness.  Lab work and EKG were offered and patient's son refused stating that patient is unsteady on his feet and has suffered numerous falls due to this in the past.    1850 scapula manipulation did for closed reduction; attempt unsuccessful  Bleeding to skin tear on right forearm unable to be controlled with direct pressure  hemostatic agents.  Superficial vessels were tied off by PA Joy. Laceration of skin repair performed by PA .   Hemostasis was achieved.  Wound was wrapped in hemostatic agent, nonadhesive gauze, and wrapped in kerlex.    Patient was discussed with and evaluated by Dr. Alvino Chapel.  Patient will need procedural sedation and reduction of his shoulder dislocation.   Patient care transferred to PA Joy at the end of my  shift. Patient presentation, ED course, and plan of care discussed with review of all pertinent labs and imaging. Please see his/her note for further details regarding further ED course and disposition.   Final Clinical Impression(s) / ED Diagnoses Final diagnoses:  Shoulder dislocation, left, initial encounter  Fall, initial encounter    Rx / DC Orders ED Discharge Orders    None       Dyann Ruddle 06/29/20 2034    Davonna Belling, MD 07/01/20 [phone]

## 2020-06-29 NOTE — ED Notes (Signed)
IV access attempted x 2 by Irish Elders, RN

## 2020-06-29 NOTE — Discharge Instructions (Addendum)
Acetaminophen: May take acetaminophen (generic for Tylenol), as needed, for pain. Your daily total maximum amount of acetaminophen from all sources should be limited to 4000mg /day for persons without liver problems, or 2000mg /day for those with liver problems.  Keep the sling in place for support and comfort. Please be sure the sling does not get wet. It can be removed for bathing. It should be tight enough to support the arm and reduce pain in the shoulder, but not so tight that there is numbness or decreased blood flow to the hand/fingers.  Follow-up with the orthopedic specialist on this matter. Call to make an appointment.   Wound Care - Laceration You may remove the bandage after 24 hours. Clean the wound and surrounding area gently with tap water and mild soap. Rinse well and blot dry. Do not scrub the wound, as this may cause the wound edges to come apart. You may shower, but avoid submerging the wound, such as with a bath or swimming. Clean the wound daily to prevent infection. Do not use cleaners such as hydrogen peroxide or alcohol.   Scar reduction: Application of a topical antibiotic ointment, such as Neosporin, after the wound has begun to close and heal well can decrease scab formation and reduce scarring. After the wound has healed and wound closures have been removed, application of ointments such as Aquaphor can also reduce scar formation.  The key to scar reduction is keeping the skin well hydrated and supple. Drinking plenty of water throughout the day (At least eight 8oz glasses of water a day) is essential to staying well hydrated.  Sun exposure: Keep the wound out of the sun. After the wound has healed, continue to protect it from the sun by wearing protective clothing or applying sunscreen.  Pain: You may use Tylenol for pain.  Suture/staple removal: The sutures are designed to dissolve on their own. If they are still in place after 14 days and are causing irritation, they  then may be removed manually.  Follow-up with a primary care provider for any further management of this wound.  Return to the ED sooner should the wound edges come apart or signs of infection arise, such as spreading redness, puffiness/swelling, pus draining from the wound, severe increase in pain, fever over 100.70F, or any other major issues.  For prescription assistance, may try using prescription discount sites or apps, such as goodrx.com

## 2020-06-29 NOTE — ED Provider Notes (Signed)
Jason Calderon is a 87 y.o. male, presenting to the ED with injuries sustained from a fall.  HPI from Debbe Mounts, PA-C: "Jason Calderon is a 87 y.o. male is a history of dementia, hypertension, CKD stage III, CAD.    Patient is a resident at [facility] skilled nursing facility.  Patient son and power of attorney is at bedside.  He reports that is contacted by [facility] facility they reported that the patient suffered an unwitnessed fall.  Patient was found laying on the floor around 1700-1730.  [facility] was contacted and patient was transported to the emergency department patient son reports that he is at his mental baseline which is alert to person.  He is not taking any blood thinners.  Patient complains of pain to his left shoulder.  Denies any other pain or injuries.  Pain is unsure what happened to causes pain.  Reports that he is right-hand dominant."   Physical Exam  BP (!) 172/77   Pulse 74   Temp 98.4 F (36.9 C) (Oral)   Resp 16   Wt 78.5 kg   SpO2 97%   BMI 26.31 kg/m   Physical Exam Vitals and nursing note reviewed.  Constitutional:      General: He is not in acute distress.    Appearance: He is well-developed and well-nourished. He is not diaphoretic.  HENT:     Head: Normocephalic.  Eyes:     Conjunctiva/sclera: Conjunctivae normal.  Cardiovascular:     Rate and Rhythm: Normal rate and regular rhythm.  Pulmonary:     Effort: Pulmonary effort is normal.  Musculoskeletal:     Cervical back: Neck supple.     Comments: What appears to be a skin tear to the right elbow is actually an avulsion injury of the skin. There also seems to be some superficial vessel damage that is causing persistent, brisk hemorrhage.  Skin:    General: Skin is warm and dry.     Capillary Refill: Capillary refill takes less than 2 seconds.     Coloration: Skin is not pale.  Neurological:     Mental Status: He is alert.     Comments: Sensation grossly intact in the extremities. Appropriate  motor function intact in the extremities. In the left upper extremity motor function was tested with movement of the left hand and elbow.  Psychiatric:        Mood and Affect: Mood and affect normal.        Behavior: Behavior normal.     ED Course/Procedures     CT Head Wo Contrast  Result Date: 06/29/2020 CLINICAL DATA:  Fall EXAM: CT HEAD WITHOUT CONTRAST TECHNIQUE: Contiguous axial images were obtained from the base of the skull through the vertex without intravenous contrast. COMPARISON:  None. FINDINGS: Brain: No evidence of acute territorial infarction, hemorrhage, hydrocephalus,extra-axial collection or mass lesion/mass effect. There is dilatation the ventricles and sulci consistent with age-related atrophy. Low-attenuation changes in the deep white matter consistent with small vessel ischemia. Vascular: No hyperdense vessel or unexpected calcification. Skull: The skull is intact. No fracture or focal lesion identified. Sinuses/Orbits: The visualized paranasal sinuses and mastoid air cells are clear. The orbits and globes intact. Other: None Cervical spine: Alignment: There is reversal of the normal cervical lordosis. Skull base and vertebrae: Visualized skull base is intact. No atlanto-occipital dissociation. The vertebral body heights are well maintained. No fracture or pathologic osseous lesion seen. Soft tissues and spinal canal: The visualized paraspinal soft tissues are unremarkable. No  prevertebral soft tissue swelling is seen. The spinal canal is grossly unremarkable, no large epidural collection or significant canal narrowing. Disc levels: Multilevel cervical spine spondylosis is seen with disc osteophyte complex and uncovertebral osteophytes most notable at C3-C4 and C5-C6 with severe neural foraminal narrowing and mild central canal stenosis. Upper chest: The lung apices are clear. Thoracic inlet is within normal limits. Other: None IMPRESSION: No acute intracranial abnormality. Findings  consistent with age related atrophy and chronic small vessel ischemia No acute fracture or malalignment of the spine. Electronically Signed   By: Prudencio Pair M.D.   On: 06/29/2020 18:47   CT Cervical Spine Wo Contrast  Result Date: 06/29/2020 CLINICAL DATA:  Fall EXAM: CT HEAD WITHOUT CONTRAST TECHNIQUE: Contiguous axial images were obtained from the base of the skull through the vertex without intravenous contrast. COMPARISON:  None. FINDINGS: Brain: No evidence of acute territorial infarction, hemorrhage, hydrocephalus,extra-axial collection or mass lesion/mass effect. There is dilatation the ventricles and sulci consistent with age-related atrophy. Low-attenuation changes in the deep white matter consistent with small vessel ischemia. Vascular: No hyperdense vessel or unexpected calcification. Skull: The skull is intact. No fracture or focal lesion identified. Sinuses/Orbits: The visualized paranasal sinuses and mastoid air cells are clear. The orbits and globes intact. Other: None Cervical spine: Alignment: There is reversal of the normal cervical lordosis. Skull base and vertebrae: Visualized skull base is intact. No atlanto-occipital dissociation. The vertebral body heights are well maintained. No fracture or pathologic osseous lesion seen. Soft tissues and spinal canal: The visualized paraspinal soft tissues are unremarkable. No prevertebral soft tissue swelling is seen. The spinal canal is grossly unremarkable, no large epidural collection or significant canal narrowing. Disc levels: Multilevel cervical spine spondylosis is seen with disc osteophyte complex and uncovertebral osteophytes most notable at C3-C4 and C5-C6 with severe neural foraminal narrowing and mild central canal stenosis. Upper chest: The lung apices are clear. Thoracic inlet is within normal limits. Other: None IMPRESSION: No acute intracranial abnormality. Findings consistent with age related atrophy and chronic small vessel ischemia  No acute fracture or malalignment of the spine. Electronically Signed   By: Prudencio Pair M.D.   On: 06/29/2020 18:47   DG Chest Portable 1 View  Result Date: 06/29/2020 CLINICAL DATA:  Unwitnessed fall. EXAM: PORTABLE CHEST 1 VIEW COMPARISON:  February 09, 2020 FINDINGS: Decreased lung volumes are seen which is likely secondary to the degree of patient inspiration. There is no evidence of acute infiltrate, pleural effusion or pneumothorax. The cardiac silhouette is markedly enlarged. Moderate severity calcification of the aortic arch is noted. Degenerative changes seen throughout the thoracic spine. IMPRESSION: Cardiomegaly without evidence of acute or active cardiopulmonary disease. Electronically Signed   By: Virgina Norfolk M.D.   On: 06/29/2020 19:04   DG Shoulder Left  Result Date: 06/29/2020 CLINICAL DATA:  Un witnessed fall, found down, left shoulder deformity EXAM: LEFT SHOULDER - 2+ VIEW COMPARISON:  02/09/2020 FINDINGS: Frontal and transscapular views of the left shoulder demonstrate anterior glenohumeral dislocation. No acute displaced fracture. Mild spurring of the undersurface of the acromion process. Left chest is clear. IMPRESSION: 1. Anterior glenohumeral dislocation.  No evidence of fracture. Electronically Signed   By: Randa Ngo M.D.   On: 06/29/2020 18:59   DG Shoulder Left Portable  Result Date: 06/29/2020 CLINICAL DATA:  Post reduction EXAM: LEFT SHOULDER COMPARISON:  June 29, 2020 at 6:30 p.m. FINDINGS: There is been interval reduction of the anterior dislocation. Humeral head now articulates with the glenoid.  No definite osseous fracture seen. Mild overlying soft tissue swelling is noted. IMPRESSION: Interval reduction of the anterior dislocation in near anatomic alignment. Electronically Signed   By: Prudencio Pair M.D.   On: 06/29/2020 21:49     .Marland KitchenLaceration Repair  Date/Time: 06/29/2020 7:50 PM Performed by: Lorayne Bender, PA-C Authorized by: Lorayne Bender, PA-C    Consent:    Consent obtained:  Verbal   Consent given by:  Patient and guardian   Risks, benefits, and alternatives were discussed: yes     Risks discussed:  Infection and need for additional repair Universal protocol:    Procedure explained and questions answered to patient or proxy's satisfaction: yes     Patient identity confirmed:  Verbally with patient and provided demographic data Laceration details:    Location:  Shoulder/arm   Shoulder/arm location:  R elbow   Length (cm):  1.5 Pre-procedure details:    Preparation:  Patient was prepped and draped in usual sterile fashion Exploration:    Hemostasis achieved with:  Tied off vessels, epinephrine and direct pressure   Wound extent: vascular damage   Treatment:    Area cleansed with:  Saline   Amount of cleaning:  Extensive   Irrigation solution:  Sterile saline   Irrigation method:  Syringe   Layers/structures repaired:  Deep subcutaneous Deep subcutaneous:    Suture size:  4-0   Suture material:  Vicryl   Suture technique:  Figure eight   Number of sutures:  4 Repair type:    Repair type:  Intermediate Post-procedure details:    Dressing:  Non-adherent dressing and sterile dressing   Procedure completion:  Tolerated well, no immediate complications  .Sedation  Date/Time: 06/29/2020 9:10 PM Performed by: Davonna Belling, MD Authorized by: Davonna Belling, MD   Consent:    Consent obtained:  Verbal and written   Consent given by:  Patient and guardian   Risks discussed:  Allergic reaction, prolonged hypoxia resulting in organ damage, respiratory compromise necessitating ventilatory assistance and intubation, vomiting, nausea, inadequate sedation and dysrhythmia   Alternatives discussed:  Analgesia without sedation Universal protocol:    Procedure explained and questions answered to patient or proxy's satisfaction: yes     Imaging studies available: yes     Required blood products, implants, devices, and  special equipment available: yes     Immediately prior to procedure, a time out was called: yes   Indications:    Procedure performed:  Dislocation reduction   Procedure necessitating sedation performed by:  Different physician Pre-sedation assessment:    Time since last food or drink:  12pm   ASA classification: class 2 - patient with mild systemic disease     Mouth opening:  3 or more finger widths   Mallampati score:  III - soft palate, base of uvula visible   Neck mobility: normal     Pre-sedation assessments completed and reviewed: airway patency, cardiovascular function, hydration status, mental status, nausea/vomiting, pain level, respiratory function and temperature     Pre-sedation assessment completed:  06/29/2020 9:05 PM Immediate pre-procedure details:    Reassessment: Patient reassessed immediately prior to procedure     Reviewed: vital signs, relevant labs/tests and NPO status     Verified: bag valve mask available, emergency equipment available, intubation equipment available, IV patency confirmed and oxygen available   Procedure details (see MAR for exact dosages):    Preoxygenation:  Nasal cannula   Sedation:  Propofol   Intended level of sedation: moderate (conscious  sedation)   Analgesia:  Fentanyl   Intra-procedure monitoring:  Blood pressure monitoring, cardiac monitor, continuous capnometry, continuous pulse oximetry, frequent LOC assessments and frequent vital sign checks   Intra-procedure events: none     Total Provider sedation time (minutes):  10 Post-procedure details:    Post-sedation assessment completed:  06/29/2020 9:25 PM   Attendance: Constant attendance by certified staff until patient recovered     Recovery: Patient returned to pre-procedure baseline     Post-sedation assessments completed and reviewed: airway patency, cardiovascular function, hydration status, mental status, nausea/vomiting, pain level, respiratory function and temperature     Patient is  stable for discharge or admission: yes     Procedure completion:  Tolerated well, no immediate complications Reduction of dislocation  Date/Time: 06/29/2020 9:20 PM Performed by: Lorayne Bender, PA-C Authorized by: Lorayne Bender, PA-C  Consent: Verbal consent obtained. Written consent obtained. Risks and benefits: risks, benefits and alternatives were discussed Consent given by: patient and guardian Patient understanding: patient states understanding of the procedure being performed Patient consent: the patient's understanding of the procedure matches consent given Imaging studies: imaging studies available Required items: required blood products, implants, devices, and special equipment available Patient identity confirmed: verbally with patient and provided demographic data Local anesthesia used: no  Anesthesia: Local anesthesia used: no  Sedation: Patient sedated: yes Sedation type: moderate (conscious) sedation Sedatives: propofol Analgesia: fentanyl Sedation start date/time: 06/29/2020 9:10 PM Sedation end date/time: 06/29/2020 9:20 PM Vitals: Vital signs were monitored during sedation.  Patient tolerance: patient tolerated the procedure well with no immediate complications     MDM   Patient care handoff report received from [facility], PA-C. Plan: Shoulder reduction pending. Likely discharge following reduction.  Patient presents for evaluation following a fall. He has a left shoulder dislocation. No evidence of neurovascular compromise. Successful reduction of left shoulder at the bedside. Circulation, motor function, sensation verified intact before and after procedure and before and after sling placement.   Findings and plan of care discussed with attending physician, Delphina Cahill, MD. Dr. Alvino Chapel personally evaluated and examined this patient.  Vitals:   06/29/20 2122 06/29/20 2131 06/29/20 2141 06/29/20 2215  BP: (!) 174/97 (!) 169/88 (!) 173/83 (!) 182/81   Pulse: 61 62 63 86  Resp: 16 20 18  (!) 23  Temp:   98.1 F (36.7 C) 98.2 F (36.8 C)  TempSrc:   Oral Oral  SpO2: 97% 97% 96% 97%  Weight:                Layla Maw 06/29/20 2231    Davonna Belling, MD 07/01/20 [phone]

## 2020-06-30 NOTE — ED Notes (Signed)
Gave patient water for PO challenge

## 2020-07-28 DEATH — deceased
